# Patient Record
Sex: Female | Born: 1955 | Race: White | Hispanic: No | Marital: Married | Smoking: Current every day smoker
Health system: Southern US, Community
[De-identification: ages and names within clinical notes are randomized; demographics above are authoritative.]

## PROBLEM LIST (undated history)

## (undated) DIAGNOSIS — F1010 Alcohol abuse, uncomplicated: Secondary | ICD-10-CM

## (undated) HISTORY — PX: OTHER SURGICAL HISTORY: SHX169

---

## 2009-04-16 ENCOUNTER — Ambulatory Visit: Payer: Self-pay | Admitting: Family Medicine

## 2009-04-16 DIAGNOSIS — F3289 Other specified depressive episodes: Secondary | ICD-10-CM | POA: Insufficient documentation

## 2009-04-16 DIAGNOSIS — F329 Major depressive disorder, single episode, unspecified: Secondary | ICD-10-CM

## 2009-04-16 DIAGNOSIS — F17200 Nicotine dependence, unspecified, uncomplicated: Secondary | ICD-10-CM

## 2009-04-16 DIAGNOSIS — R259 Unspecified abnormal involuntary movements: Secondary | ICD-10-CM | POA: Insufficient documentation

## 2009-04-16 DIAGNOSIS — F411 Generalized anxiety disorder: Secondary | ICD-10-CM

## 2009-04-16 LAB — CONVERTED CEMR LAB
ALT: 63 units/L — ABNORMAL HIGH (ref 0–35)
AST: 56 units/L — ABNORMAL HIGH (ref 0–37)
Alcohol, Ethyl (B): <10 mg/dL (ref 0–10)
Alkaline Phosphatase: 61 units/L (ref 39–117)
Ammonia: 29 umol/L (ref 11–35)
BUN: 9 mg/dL (ref 6–23)
Bilirubin, Direct: 0.2 mg/dL (ref 0.0–0.3)
Calcium: 9.7 mg/dL (ref 8.4–10.5)
Creatinine, Ser: 0.5 mg/dL (ref 0.4–1.2)
Eosinophils Relative: 0.6 % (ref 0.0–5.0)
Free T4: 0.7 ng/dL (ref 0.6–1.6)
GFR calc non Af Amer: 136.83 mL/min (ref >60)
Lymphocytes Relative: 22.5 % (ref 12.0–46.0)
Monocytes Absolute: 0.7 10*3/uL (ref 0.1–1.0)
Monocytes Relative: 15.9 % — ABNORMAL HIGH (ref 3.0–12.0)
Neutrophils Relative %: 60.9 % (ref 43.0–77.0)
Platelets: 173 10*3/uL (ref 150.0–400.0)
RBC: 4.19 M/uL (ref 3.87–5.11)
Saturation Ratios: 36.1 % (ref 20.0–50.0)
Total Bilirubin: 1.3 mg/dL — ABNORMAL HIGH (ref 0.3–1.2)
Transferrin: 227.5 mg/dL (ref 212.0–360.0)
Vitamin B-12: 418 pg/mL (ref 211–911)
WBC: 4.1 10*3/uL — ABNORMAL LOW (ref 4.5–10.5)

## 2009-04-17 ENCOUNTER — Ambulatory Visit: Payer: Self-pay | Admitting: Family Medicine

## 2009-04-17 DIAGNOSIS — F1020 Alcohol dependence, uncomplicated: Secondary | ICD-10-CM | POA: Insufficient documentation

## 2011-04-12 ENCOUNTER — Emergency Department (HOSPITAL_COMMUNITY)
Admission: EM | Admit: 2011-04-12 | Discharge: 2011-04-12 | Disposition: A | Discharge status: Home / Self Care | Payer: No Typology Code available for payment source | Attending: Emergency Medicine | Admitting: Emergency Medicine

## 2011-04-12 ENCOUNTER — Emergency Department (HOSPITAL_COMMUNITY): Payer: No Typology Code available for payment source

## 2011-04-12 DIAGNOSIS — F1020 Alcohol dependence, uncomplicated: Secondary | ICD-10-CM | POA: Insufficient documentation

## 2011-04-12 DIAGNOSIS — S42209A Unspecified fracture of upper end of unspecified humerus, initial encounter for closed fracture: Secondary | ICD-10-CM | POA: Insufficient documentation

## 2011-04-12 DIAGNOSIS — Y92009 Unspecified place in unspecified non-institutional (private) residence as the place of occurrence of the external cause: Secondary | ICD-10-CM | POA: Insufficient documentation

## 2011-04-12 DIAGNOSIS — M25519 Pain in unspecified shoulder: Secondary | ICD-10-CM | POA: Insufficient documentation

## 2011-04-12 DIAGNOSIS — W010XXA Fall on same level from slipping, tripping and stumbling without subsequent striking against object, initial encounter: Secondary | ICD-10-CM | POA: Insufficient documentation

## 2011-04-18 ENCOUNTER — Emergency Department (HOSPITAL_COMMUNITY)
Admission: EM | Admit: 2011-04-18 | Discharge: 2011-04-18 | Disposition: A | Discharge status: Home / Self Care | Payer: No Typology Code available for payment source | Attending: Emergency Medicine | Admitting: Emergency Medicine

## 2011-04-18 DIAGNOSIS — E860 Dehydration: Secondary | ICD-10-CM | POA: Insufficient documentation

## 2011-04-18 DIAGNOSIS — F1010 Alcohol abuse, uncomplicated: Secondary | ICD-10-CM | POA: Insufficient documentation

## 2011-04-18 LAB — COMPREHENSIVE METABOLIC PANEL
ALT: 27 U/L (ref 0–35)
Calcium: 9.7 mg/dL (ref 8.4–10.5)
Creatinine, Ser: <0.47 mg/dL — ABNORMAL LOW (ref 0.50–1.10)
Glucose, Bld: 104 mg/dL — ABNORMAL HIGH (ref 70–99)
Sodium: 123 mEq/L — ABNORMAL LOW (ref 135–145)
Total Protein: 7.6 g/dL (ref 6.0–8.3)

## 2011-04-18 LAB — DIFFERENTIAL
Basophils Absolute: 0 10*3/uL (ref 0.0–0.1)
Basophils Relative: 1 % (ref 0–1)
Monocytes Relative: 15 % — ABNORMAL HIGH (ref 3–12)
Neutro Abs: 4 10*3/uL (ref 1.7–7.7)
Neutrophils Relative %: 61 % (ref 43–77)

## 2011-04-18 LAB — CBC
Hemoglobin: 11.8 g/dL — ABNORMAL LOW (ref 12.0–15.0)
RBC: 3.5 MIL/uL — ABNORMAL LOW (ref 3.87–5.11)

## 2011-04-18 LAB — RAPID URINE DRUG SCREEN, HOSP PERFORMED
Amphetamines: NOT DETECTED
Tetrahydrocannabinol: NOT DETECTED

## 2011-04-25 ENCOUNTER — Encounter (HOSPITAL_BASED_OUTPATIENT_CLINIC_OR_DEPARTMENT_OTHER)
Admission: RE | Admit: 2011-04-25 | Discharge: 2011-04-25 | Disposition: A | Discharge status: Home / Self Care | Payer: No Typology Code available for payment source | Source: Ambulatory Visit | Attending: Orthopedic Surgery | Admitting: Orthopedic Surgery

## 2011-04-26 ENCOUNTER — Ambulatory Visit (HOSPITAL_COMMUNITY): Payer: No Typology Code available for payment source | Attending: Orthopedic Surgery

## 2011-04-26 ENCOUNTER — Ambulatory Visit (HOSPITAL_BASED_OUTPATIENT_CLINIC_OR_DEPARTMENT_OTHER)
Admission: RE | Admit: 2011-04-26 | Discharge: 2011-04-27 | Disposition: A | Discharge status: Home / Self Care | Payer: No Typology Code available for payment source | Source: Ambulatory Visit | Attending: Orthopedic Surgery | Admitting: Orthopedic Surgery

## 2011-04-26 ENCOUNTER — Ambulatory Visit (HOSPITAL_COMMUNITY): Payer: No Typology Code available for payment source

## 2011-04-26 DIAGNOSIS — Z01810 Encounter for preprocedural cardiovascular examination: Secondary | ICD-10-CM | POA: Insufficient documentation

## 2011-04-26 DIAGNOSIS — S42213A Unspecified displaced fracture of surgical neck of unspecified humerus, initial encounter for closed fracture: Secondary | ICD-10-CM | POA: Insufficient documentation

## 2011-04-26 DIAGNOSIS — W19XXXA Unspecified fall, initial encounter: Secondary | ICD-10-CM | POA: Insufficient documentation

## 2011-04-26 DIAGNOSIS — Z01812 Encounter for preprocedural laboratory examination: Secondary | ICD-10-CM | POA: Insufficient documentation

## 2011-04-26 DIAGNOSIS — X58XXXA Exposure to other specified factors, initial encounter: Secondary | ICD-10-CM | POA: Insufficient documentation

## 2011-04-26 DIAGNOSIS — S42209A Unspecified fracture of upper end of unspecified humerus, initial encounter for closed fracture: Secondary | ICD-10-CM | POA: Insufficient documentation

## 2011-04-26 LAB — POCT HEMOGLOBIN-HEMACUE: Hemoglobin: 13.3 g/dL (ref 12.0–15.0)

## 2011-05-06 NOTE — Op Note (Signed)
Brittany Powell, Brittany Powell                ACCOUNT NO.:  [id number]  MEDICAL RECORD NO.:  [id number]  LOCATION:  WLED                         FACILITY:  [facility]  PHYSICIAN:  Jones Broom, MD    DATE OF BIRTH:  Sep 25, 1955  DATE OF PROCEDURE:  04/26/2011 DATE OF DISCHARGE:  04/18/2011                              OPERATIVE REPORT   PREOPERATIVE DIAGNOSIS:  Right displaced proximal humerus fracture.  POSTOPERATIVE DIAGNOSIS:  Right displaced proximal humerus fracture.  PROCEDURE PERFORMED:  Open reduction and internal fixation right proximal humerus fracture.  ATTENDING SURGEON:  Berline Lopes, MD  ASSISTANT:  Misty Stanley, PA-S  ANESTHESIA:  GOT with preoperative interscalene block.  COMPLICATIONS:  None.  DRAINS:  None.  SPECIMENS:  None.  ESTIMATED BLOOD LOSS:  100 mL.  INDICATIONS FOR SURGERY:  The patient is a 55 year old female who fell suffering a right displaced proximal humerus fracture.  She was seen by me approximately 10 days out from her injury where she was noted to have significant displacement of the fracture.  She was indicated for operative treatment to promote fracture healing and restore anatomic alignment.  She understood risks, benefits and alternatives of the procedure including, but not limited to risk of bleeding, infection, damage to neurovascular structures, risk of nonunion, malunion, potential need for future surgery including hardware removal.  She understood all this and elected to go forward with surgery.  OPERATIVE FINDINGS:  The fracture extended up to the lateral edge of the greater tuberosity.  It was significantly shortened initially and subsequently was pulled out to length after careful dissection. Fracture was fixed in a near-anatomic position with a S3 Biomet/DePuy plate with 6 locking pegs in the head and three 3.5-mm bicortical screws in the shaft with excellent fixation.  Additional fixation was achieved with three #2 fiber wires  through the cuff anterior, superior, posterior which were passed through the plate at the conclusion of procedure.  DESCRIPTION OF PROCEDURE:  The patient was identified in the preoperative holding area where I personally marked the operative site after verifying site, side, and procedure with the patient.  She was taken back to the operating room where general anesthesia was induced without complication.  She had interscalene block in the holding area by the attending anesthesiologist.  She was placed in a beach-chair position with all extremities carefully padded in position and head and neck carefully padded in position.  The right upper extremity was then prepped and draped in standard sterile fashion.  The patient did receive 1 gram IV vancomycin preoperatively.  After the appropriate time-out procedure, an approximately 10-cm incision was made from the coracoid tip to the mid level of the humerus.  Dissection was carried down through subcutaneous tissues to the level of the cephalic vein which was identified and taken laterally with the deltoid.  The deltopectoral interval was opened and the underlying conjoined tendon was identified. A retractor was placed beneath the conjoined tendon and beneath the deltoid laterally exposing the fracture.  The fracture was then carefully exposed using rongeur, Cobb elevators.  The proximal shaft was displaced anteriorly to allow exposure of the head and cuff insertion. The cuff bone/tendon junction was carefully then  controlled with three #2 fiber wires with one anteriorly in the subscapularis, one superiorly in the supraspinatus, one posteriorly in the infraspinatus at the bone/tendon junction.  The biceps tendon was identified and nearly completely lacerated from the injury.  The laceration was completed and the tendon was then cut at the level of the supraglenoid tubercle as well as at the level of the pectoralis major, and the  intervening segment was discarded.  The fracture was then reduced by pulling traction and using bone reduction forceps on the shaft.  After near- anatomic reduction was achieved, the plate was laid laterally and one K- wire was placed to verify positioning of the plate.  After the appropriate position was verified, the oblong screw ulna shaft was drilled, measured and filled with the appropriate 3.5 screw.  The proximal locking pegs were then placed sequentially with all obtaining good locking fixation into the plate.  No screws were felt to penetrate. Fluoroscopic imaging demonstrated appropriate reduction of the fracture and placement of the pegs with appropriate length and floor and round- the-world imaging with no penetration and the two remaining shaft screws were then drilled, measured and filled.  Final fluoroscopic imaging demonstrated appropriate reduction of the fracture with appropriate positioning of the hardware.  Wound was then copiously irrigated with normal saline.  The cephalic vein was noted to be completely transected from retraction through the procedure and this was carefully ligated. The wound was copiously irrigated and subsequently closed in layers with 2-0 Vicryl in deep dermal layer, 4-0 Monocryl for skin closure and Steri- Strips.  Sterile dressings were then applied including 4 x 4's, ABDs, and tape.  The patient was then allowed to awaken from general anesthesia, transferred to stretcher and taken to the recovery room in stable condition in a sling.  POSTOPERATIVE PLAN:  She will be kept overnight for pain control and antibiotics.  She will be discharged in the a.m.  She will begin hand, wrist, elbow motion and some gentle passive motion when she is comfortable.  She will follow up in 7-10 days.     Jones Broom, MD     JC/MEDQ  D:  04/26/2011  T:  04/27/2011  Job:  [id number]  Electronically Signed by Jones Broom  on 05/06/2011 09:39:48 AM

## 2011-07-18 ENCOUNTER — Emergency Department (HOSPITAL_COMMUNITY)
Admission: EM | Admit: 2011-07-18 | Discharge: 2011-07-19 | Disposition: A | Discharge status: Other Acute Inpatient Hospital | Payer: No Typology Code available for payment source | Attending: Psychiatry | Admitting: Psychiatry

## 2011-07-18 ENCOUNTER — Encounter: Payer: Self-pay | Admitting: *Deleted

## 2011-07-18 DIAGNOSIS — F17200 Nicotine dependence, unspecified, uncomplicated: Secondary | ICD-10-CM | POA: Insufficient documentation

## 2011-07-18 DIAGNOSIS — F10929 Alcohol use, unspecified with intoxication, unspecified: Secondary | ICD-10-CM

## 2011-07-18 DIAGNOSIS — F1010 Alcohol abuse, uncomplicated: Secondary | ICD-10-CM | POA: Insufficient documentation

## 2011-07-18 HISTORY — DX: Alcohol abuse, uncomplicated: F10.10

## 2011-07-18 LAB — CBC
HCT: 37.5 % (ref 36.0–46.0)
Hemoglobin: 13.1 g/dL (ref 12.0–15.0)
MCH: 32.3 pg (ref 26.0–34.0)
MCHC: 34.9 g/dL (ref 30.0–36.0)
MCV: 92.4 fL (ref 78.0–100.0)

## 2011-07-18 LAB — URINALYSIS, ROUTINE W REFLEX MICROSCOPIC
Hgb urine dipstick: NEGATIVE
Nitrite: NEGATIVE
Specific Gravity, Urine: 1.003 — ABNORMAL LOW (ref 1.005–1.030)
Urobilinogen, UA: 0.2 mg/dL (ref 0.0–1.0)

## 2011-07-18 LAB — BASIC METABOLIC PANEL
BUN: <3 mg/dL — ABNORMAL LOW (ref 6–23)
Calcium: 9 mg/dL (ref 8.4–10.5)
GFR calc non Af Amer: >90 mL/min (ref >90)
Glucose, Bld: 101 mg/dL — ABNORMAL HIGH (ref 70–99)

## 2011-07-18 LAB — DIFFERENTIAL
Basophils Relative: 3 % — ABNORMAL HIGH (ref 0–1)
Eosinophils Absolute: 0.1 10*3/uL (ref 0.0–0.7)
Monocytes Absolute: 0.7 10*3/uL (ref 0.1–1.0)
Neutro Abs: 2.2 10*3/uL (ref 1.7–7.7)

## 2011-07-18 LAB — RAPID URINE DRUG SCREEN, HOSP PERFORMED
Cocaine: NOT DETECTED
Opiates: NOT DETECTED

## 2011-07-18 MED ORDER — FOLIC ACID 1 MG PO TABS
1.0000 mg | ORAL_TABLET | Freq: Every day | ORAL | Status: DC
  Administered 2011-07-19: 1 mg via ORAL
  Filled 2011-07-18: qty 1

## 2011-07-18 MED ORDER — ONDANSETRON HCL 4 MG PO TABS: 4.0000 mg | ORAL_TABLET | Freq: Three times a day (TID) | ORAL | Status: DC | PRN

## 2011-07-18 MED ORDER — NICOTINE 21 MG/24HR TD PT24
21.0000 mg | MEDICATED_PATCH | Freq: Every day | TRANSDERMAL | Status: DC
  Administered 2011-07-19: 21 mg via TRANSDERMAL

## 2011-07-18 MED ORDER — ACETAMINOPHEN 325 MG PO TABS: 650.0000 mg | ORAL_TABLET | ORAL | Status: DC | PRN

## 2011-07-18 MED ORDER — VITAMIN B-1 100 MG PO TABS
100.0000 mg | ORAL_TABLET | Freq: Every day | ORAL | Status: DC
  Administered 2011-07-19: 100 mg via ORAL
  Filled 2011-07-18: qty 1

## 2011-07-18 MED ORDER — POTASSIUM CHLORIDE 20 MEQ PO PACK
40.0000 meq | PACK | Freq: Once | ORAL | Status: DC
  Filled 2011-07-18: qty 2

## 2011-07-18 MED ORDER — LORAZEPAM 1 MG PO TABS
1.0000 mg | ORAL_TABLET | Freq: Four times a day (QID) | ORAL | Status: DC | PRN
  Administered 2011-07-19 (×3): 1 mg via ORAL
  Filled 2011-07-18 (×3): qty 1

## 2011-07-18 MED ORDER — LORAZEPAM 1 MG PO TABS: 1.0000 mg | ORAL_TABLET | Freq: Three times a day (TID) | ORAL | Status: DC | PRN

## 2011-07-18 MED ORDER — LORAZEPAM 2 MG/ML IJ SOLN: 1.0000 mg | Freq: Four times a day (QID) | INTRAMUSCULAR | Status: DC | PRN

## 2011-07-18 MED ORDER — NICOTINE 21 MG/24HR TD PT24
21.0000 mg | MEDICATED_PATCH | Freq: Once | TRANSDERMAL | Status: DC
  Administered 2011-07-18: 21 mg via TRANSDERMAL
  Filled 2011-07-18 (×2): qty 1

## 2011-07-18 MED ORDER — THERA M PLUS PO TABS
1.0000 | ORAL_TABLET | Freq: Every day | ORAL | Status: DC
  Administered 2011-07-19: 10:00:00 via ORAL
  Filled 2011-07-18: qty 1

## 2011-07-18 MED ORDER — LORAZEPAM 1 MG PO TABS
1.0000 mg | ORAL_TABLET | Freq: Once | ORAL | Status: AC
  Administered 2011-07-18: 1 mg via ORAL
  Filled 2011-07-18: qty 1

## 2011-07-18 MED ORDER — THIAMINE HCL 100 MG/ML IJ SOLN: 100.0000 mg | Freq: Every day | INTRAMUSCULAR | Status: DC

## 2011-07-18 MED ORDER — ALUM & MAG HYDROXIDE-SIMETH 200-200-20 MG/5ML PO SUSP: 30.0000 mL | ORAL | Status: DC | PRN

## 2011-07-18 MED ORDER — POTASSIUM CHLORIDE CRYS ER 20 MEQ PO TBCR
EXTENDED_RELEASE_TABLET | ORAL | Status: AC
  Administered 2011-07-18: 40 meq via ORAL
  Filled 2011-07-18: qty 2

## 2011-07-18 NOTE — ED Notes (Signed)
Patient belongings with family. In triage room 4. Already changed in paper scrubs and has been wanded,

## 2011-07-18 NOTE — ED Notes (Signed)
Pt requesting detox from etoh. 5 beers drank today, last drink 4pm. Last detox pt began having nausea/tremors.

## 2011-07-18 NOTE — ED Notes (Signed)
Pt denies SI/HI 

## 2011-07-18 NOTE — ED Provider Notes (Signed)
History     CSN: [id number] Arrival date & time: 07/18/2011  5:39 PM      Chief Complaint  Patient presents with  . Drug / Alcohol Assessment   Patient is a 55 y.o. female presenting with drug/alcohol assessment. The history is provided by the patient and a relative. History Limited By: intoxicated.  Drug / Alcohol Assessment  Pt was seen at 1840.  Per pt and family, c/o gradual onset and persistence of constant ETOH abuse for "years."  States her last intake was approx 2 hours PTA.  States she "gets shaky" and nauseated if she does not drink alcohol.  Denies any other complaints. Denies HI, no SI.  Denies CP/SOB, no abd pain, no N/V/D.    Past Medical History  Diagnosis Date  . Alcohol abuse     Past Surgical History  Procedure Date  . Arm surgery      History  Substance Use Topics  . Smoking status: Current Everyday Smoker  . Smokeless tobacco: Not on file  . Alcohol Use: Yes     heavily   Review of Systems  Unable to perform ROS: Other    Allergies  Doxycycline and Penicillins  Home Medications   Current Outpatient Rx  Name Route Sig Dispense Refill  . ALPRAZOLAM 0.5 MG PO TABS Oral Take 0.25-0.5 mg by mouth at bedtime as needed. For anxiety/sleep.     Marland Kitchen CONJ ESTROG-MEDROXYPROGEST ACE 0.45-1.5 MG PO TABS Oral Take 1 tablet by mouth daily.      Carma Leaven M PLUS PO TABS Oral Take 1 tablet by mouth daily.      . VENLAFAXINE HCL 75 MG PO CP24 Oral Take 75 mg by mouth 2 (two) times daily.        BP 118/73  Pulse 75  Temp(Src) 98.3 F (36.8 C) (Oral)  Resp 17  Ht 5\' 8"  (1.727 m)  Wt 112 lb (50.803 kg)  BMI 17.03 kg/m2  SpO2 98%  Physical Exam 1845: Physical examination:  Nursing notes reviewed; Vital signs and O2 SAT reviewed;  Constitutional: Well developed, Well nourished, Well hydrated, In no acute distress; Head:  Normocephalic, atraumatic; Eyes: EOMI, PERRL, No scleral icterus; ENMT: Mouth and pharynx normal, Mucous membranes moist; Neck: Supple, Full  range of motion, No lymphadenopathy; Cardiovascular: Regular rate and rhythm, No murmur, rub, or gallop; Respiratory: Breath sounds clear & equal bilaterally, No rales, rhonchi, wheezes, or rub, Normal respiratory effort/excursion; Chest: Nontender, Movement normal; Abdomen: Soft, Nontender, Nondistended, Normal bowel sounds; Extremities: Pulses normal, No tenderness, No edema, No calf edema or asymmetry.; Neuro: AA&Ox3, Major CN grossly intact.  No gross focal motor or sensory deficits in extremities.; Skin: Color normal, Warm, Dry, Psych:  Affect flat.  Denies SI, no HI.   ED Course  Procedures   MDM  MDM Reviewed: nursing note and vitals Interpretation: labs   Results for orders placed during the hospital encounter of 07/18/11  ETHANOL      Component Value Range   Alcohol, Ethyl (B) 304 (*) 0 - 11 (mg/dL)  URINALYSIS, ROUTINE W REFLEX MICROSCOPIC      Component Value Range   Color, Urine YELLOW  YELLOW    Appearance CLEAR  CLEAR    Specific Gravity, Urine 1.003 (*) 1.005 - 1.030    pH 6.0  5.0 - 8.0    Glucose, UA NEGATIVE  NEGATIVE (mg/dL)   Hgb urine dipstick NEGATIVE  NEGATIVE    Bilirubin Urine NEGATIVE  NEGATIVE    Ketones,  ur NEGATIVE  NEGATIVE (mg/dL)   Protein, ur NEGATIVE  NEGATIVE (mg/dL)   Urobilinogen, UA 0.2  0.0 - 1.0 (mg/dL)   Nitrite NEGATIVE  NEGATIVE    Leukocytes, UA NEGATIVE  NEGATIVE   URINE RAPID DRUG SCREEN (HOSP PERFORMED)      Component Value Range   Opiates NONE DETECTED  NONE DETECTED    Cocaine NONE DETECTED  NONE DETECTED    Benzodiazepines NONE DETECTED  NONE DETECTED    Amphetamines NONE DETECTED  NONE DETECTED    Tetrahydrocannabinol NONE DETECTED  NONE DETECTED    Barbiturates NONE DETECTED  NONE DETECTED   CBC      Component Value Range   WBC 5.3  4.0 - 10.5 (K/uL)   RBC 4.06  3.87 - 5.11 (MIL/uL)   Hemoglobin 13.1  12.0 - 15.0 (g/dL)   HCT 11.9  14.7 - 82.9 (%)   MCV 92.4  78.0 - 100.0 (fL)   MCH 32.3  26.0 - 34.0 (pg)   MCHC 34.9   30.0 - 36.0 (g/dL)   RDW 56.2  13.0 - 86.5 (%)   Platelets 181  150 - 400 (K/uL)  DIFFERENTIAL      Component Value Range   Neutrophils Relative 41 (*) 43 - 77 (%)   Lymphocytes Relative 40  12 - 46 (%)   Monocytes Relative 14 (*) 3 - 12 (%)   Eosinophils Relative 2  0 - 5 (%)   Basophils Relative 3 (*) 0 - 1 (%)   Neutro Abs 2.2  1.7 - 7.7 (K/uL)   Lymphs Abs 2.1  0.7 - 4.0 (K/uL)   Monocytes Absolute 0.7  0.1 - 1.0 (K/uL)   Eosinophils Absolute 0.1  0.0 - 0.7 (K/uL)   Basophils Absolute 0.2 (*) 0.0 - 0.1 (K/uL)   WBC Morphology ATYPICAL LYMPHOCYTES    BASIC METABOLIC PANEL      Component Value Range   Sodium 129 (*) 135 - 145 (mEq/L)   Potassium 3.1 (*) 3.5 - 5.1 (mEq/L)   Chloride 91 (*) 96 - 112 (mEq/L)   CO2 24  19 - 32 (mEq/L)   Glucose, Bld 101 (*) 70 - 99 (mg/dL)   BUN <3 (*) 6 - 23 (mg/dL)   Creatinine, Ser 7.84 (*) 0.50 - 1.10 (mg/dL)   Calcium 9.0  8.4 - 69.6 (mg/dL)   GFR calc non Af Amer >90  >90 (mL/min)   GFR calc Af Amer >90  >90 (mL/min)   8:29 PM:  Will replete potassium PO, ativan already given for shakiness.  Will need lower etoh before ACT eval.   11:25 PM:  Etoh lower now.  CIWA protocol and ED Psych holding orders already completed.  Potassium repleted PO, BMP ordered to recheck electrolytes tomorrow morning.  ACT team to eval to admit for etoh abuse.    Daniella Dewberry Allison Quarry, DO 07/19/11 0016

## 2011-07-19 ENCOUNTER — Encounter (HOSPITAL_COMMUNITY): Payer: Self-pay | Admitting: *Deleted

## 2011-07-19 ENCOUNTER — Inpatient Hospital Stay (HOSPITAL_COMMUNITY)
Admission: AD | Admit: 2011-07-19 | Discharge: 2011-07-26 | DRG: 897 | Disposition: A | Discharge status: Home / Self Care | Payer: No Typology Code available for payment source | Source: Ambulatory Visit | Attending: Psychiatry | Admitting: Psychiatry

## 2011-07-19 DIAGNOSIS — F1020 Alcohol dependence, uncomplicated: Principal | ICD-10-CM | POA: Diagnosis present

## 2011-07-19 DIAGNOSIS — F17200 Nicotine dependence, unspecified, uncomplicated: Secondary | ICD-10-CM

## 2011-07-19 DIAGNOSIS — F1994 Other psychoactive substance use, unspecified with psychoactive substance-induced mood disorder: Secondary | ICD-10-CM

## 2011-07-19 DIAGNOSIS — Z6379 Other stressful life events affecting family and household: Secondary | ICD-10-CM

## 2011-07-19 DIAGNOSIS — F339 Major depressive disorder, recurrent, unspecified: Secondary | ICD-10-CM | POA: Diagnosis present

## 2011-07-19 DIAGNOSIS — Z818 Family history of other mental and behavioral disorders: Secondary | ICD-10-CM

## 2011-07-19 LAB — BASIC METABOLIC PANEL
BUN: 3 mg/dL — ABNORMAL LOW (ref 6–23)
CO2: 24 mEq/L (ref 19–32)
GFR calc non Af Amer: >90 mL/min (ref >90)
Glucose, Bld: 79 mg/dL (ref 70–99)
Potassium: 3.7 mEq/L (ref 3.5–5.1)

## 2011-07-19 LAB — URINE CULTURE: Culture: NO GROWTH

## 2011-07-19 MED ORDER — LOPERAMIDE HCL 2 MG PO CAPS: 2.0000 mg | ORAL_CAPSULE | ORAL | Status: AC | PRN

## 2011-07-19 MED ORDER — THIAMINE HCL 100 MG/ML IJ SOLN
100.0000 mg | Freq: Every day | INTRAMUSCULAR | Status: DC
  Administered 2011-07-21: 100 mg via INTRAVENOUS

## 2011-07-19 MED ORDER — FOLIC ACID 1 MG PO TABS
1.0000 mg | ORAL_TABLET | Freq: Every day | ORAL | Status: DC
  Administered 2011-07-20 – 2011-07-26 (×7): 1 mg via ORAL
  Filled 2011-07-19 (×9): qty 1

## 2011-07-19 MED ORDER — THERA M PLUS PO TABS
1.0000 | ORAL_TABLET | Freq: Every day | ORAL | Status: DC
  Administered 2011-07-20 – 2011-07-26 (×7): 1 via ORAL
  Filled 2011-07-19 (×8): qty 1

## 2011-07-19 MED ORDER — DIAZEPAM 5 MG PO TABS
10.0000 mg | ORAL_TABLET | Freq: Once | ORAL | Status: AC
  Administered 2011-07-19: 10 mg via ORAL
  Filled 2011-07-19: qty 2

## 2011-07-19 MED ORDER — VENLAFAXINE HCL ER 75 MG PO CP24
75.0000 mg | ORAL_CAPSULE | Freq: Every day | ORAL | Status: DC
  Administered 2011-07-19: 75 mg via ORAL
  Filled 2011-07-19 (×3): qty 1

## 2011-07-19 MED ORDER — ACETAMINOPHEN 325 MG PO TABS: 650.0000 mg | ORAL_TABLET | Freq: Four times a day (QID) | ORAL | Status: DC | PRN

## 2011-07-19 MED ORDER — HYDROCORTISONE 1 % EX CREA
TOPICAL_CREAM | Freq: Two times a day (BID) | CUTANEOUS | Status: DC
  Administered 2011-07-19: 13:00:00 via TOPICAL
  Filled 2011-07-19: qty 28

## 2011-07-19 MED ORDER — HYDROXYZINE PAMOATE 25 MG PO CAPS
25.0000 mg | ORAL_CAPSULE | Freq: Four times a day (QID) | ORAL | Status: AC | PRN
  Administered 2011-07-19 – 2011-07-21 (×3): 25 mg via ORAL

## 2011-07-19 MED ORDER — CHLORDIAZEPOXIDE HCL 25 MG PO CAPS: 25.0000 mg | ORAL_CAPSULE | ORAL | Status: DC

## 2011-07-19 MED ORDER — CHLORDIAZEPOXIDE HCL 25 MG PO CAPS
25.0000 mg | ORAL_CAPSULE | Freq: Four times a day (QID) | ORAL | Status: AC
  Administered 2011-07-19 – 2011-07-20 (×4): 25 mg via ORAL
  Filled 2011-07-19 (×4): qty 1

## 2011-07-19 MED ORDER — CHLORDIAZEPOXIDE HCL 25 MG PO CAPS: 25.0000 mg | ORAL_CAPSULE | Freq: Every day | ORAL | Status: DC

## 2011-07-19 MED ORDER — MAGNESIUM HYDROXIDE 400 MG/5ML PO SUSP: 30.0000 mL | Freq: Every day | ORAL | Status: DC | PRN

## 2011-07-19 MED ORDER — ALUM & MAG HYDROXIDE-SIMETH 200-200-20 MG/5ML PO SUSP: 30.0000 mL | ORAL | Status: DC | PRN

## 2011-07-19 MED ORDER — CHLORDIAZEPOXIDE HCL 25 MG PO CAPS
25.0000 mg | ORAL_CAPSULE | Freq: Four times a day (QID) | ORAL | Status: DC | PRN
  Administered 2011-07-20: 25 mg via ORAL
  Filled 2011-07-19: qty 1

## 2011-07-19 MED ORDER — ONDANSETRON HCL 4 MG PO TABS: 4.0000 mg | ORAL_TABLET | Freq: Three times a day (TID) | ORAL | Status: DC | PRN

## 2011-07-19 MED ORDER — VENLAFAXINE HCL ER 75 MG PO CP24
75.0000 mg | ORAL_CAPSULE | Freq: Every day | ORAL | Status: DC
  Administered 2011-07-20: 75 mg via ORAL
  Filled 2011-07-19 (×2): qty 1

## 2011-07-19 MED ORDER — VITAMIN B-1 100 MG PO TABS
100.0000 mg | ORAL_TABLET | Freq: Every day | ORAL | Status: DC
  Administered 2011-07-20 – 2011-07-26 (×6): 100 mg via ORAL
  Filled 2011-07-19 (×9): qty 1

## 2011-07-19 MED ORDER — CHLORDIAZEPOXIDE HCL 25 MG PO CAPS
25.0000 mg | ORAL_CAPSULE | Freq: Three times a day (TID) | ORAL | Status: DC
  Administered 2011-07-21 (×2): 25 mg via ORAL
  Filled 2011-07-19 (×2): qty 1

## 2011-07-19 MED ORDER — CHLORDIAZEPOXIDE HCL 25 MG PO CAPS
25.0000 mg | ORAL_CAPSULE | Freq: Once | ORAL | Status: AC
  Administered 2011-07-19: 25 mg via ORAL
  Filled 2011-07-19: qty 1

## 2011-07-19 NOTE — ED Provider Notes (Signed)
Patient is here voluntarily for alcohol abuse and rehabilitation.  She is currently having no significant tremors because she has been on the CIWA protocol.  She states that she still would like to receive treatment for this at this time and has no other acute medical issues.  Nat Christen, MD 07/19/11 [phone]

## 2011-07-19 NOTE — ED Notes (Signed)
Patient family taken clothes home

## 2011-07-19 NOTE — ED Notes (Signed)
Pt arrived to the room went into introduce self to the pt pt irritable reports that "I just want to go to sleep just tell me in the morning what you need to tell me" when asked what was the reason for coming to the ed pt reports "I just told them" when reconfirming information pt sts "I've already told you" pt refusing to speak with this recorder and take medications that were pending prior to arrival to the unit.

## 2011-07-19 NOTE — BH Assessment (Addendum)
Assessment Note   Brittany Powell is an 55 y.o. female  PRESENTING PROBLEM:  that was brought to the ER by her daughter and her best friend.  Patient was shaking visibility.  Pt was agitated and displayed a reluctance to participate in the assessment process.  Patient denied any psychosis.  Pateint's family has witness her talking to animals.  Patient denies any psychosis.  Patient reports that she has been drinking everyday for the past 20 years.  Patient reports that she does not want to be in detox for more than three days.  Patient reports that she does not have a problem with her beer intake.  Patient reports that she would rather be treated for her detox an outpatient level.  Patient reports that she promised her daughter that, "she would, "go to drug detox".  Patient reports that she has never been to any type of rehab in the past.  Patient reports that she has been exhibiting the following withdrawal symptoms: vomiting, irritability, tremors, weakness, blackouts  DISPOSITION:  Pending placement @ [facility].      AxisI             303.90  Alcohol Dependence  AxisII             799.9 Axis III           NONE Axis IV           Prob with prim support group    Axis V            46  Past Medical History:  Past Medical History  Diagnosis Date  . Alcohol abuse     Past Surgical History  Procedure Date  . Arm surgery     Family History: No family history on file.  Social History:  reports that she has been smoking.  She does not have any smokeless tobacco history on file. She reports that she drinks alcohol. She reports that she uses illicit drugs (Marijuana).  Allergies:  Allergies  Allergen Reactions  . Doxycycline   . Penicillins     Home Medications:  Medications Prior to Admission  Medication Dose Route Frequency Provider Last Rate Last Dose  . acetaminophen (TYLENOL) tablet 650 mg  650 mg Oral Q4H PRN Laray Anger, DO      . alum & mag hydroxide-simeth (MAALOX/MYLANTA)  200-200-20 MG/5ML suspension 30 mL  30 mL Oral PRN Laray Anger, DO      . folic acid (FOLVITE) tablet 1 mg  1 mg Oral Daily Laray Anger, DO      . LORazepam (ATIVAN) tablet 1 mg  1 mg Oral Q6H PRN Laray Anger, DO       Or  . LORazepam (ATIVAN) injection 1 mg  1 mg Intravenous Q6H PRN Laray Anger, DO      . LORazepam (ATIVAN) tablet 1 mg  1 mg Oral Once Laray Anger, DO   1 mg at 07/18/11 2108  . LORazepam (ATIVAN) tablet 1 mg  1 mg Oral Q8H PRN Laray Anger, DO      . multivitamins ther. w/minerals tablet 1 tablet  1 tablet Oral Daily Laray Anger, DO      . nicotine (NICODERM CQ - dosed in mg/24 hours) patch 21 mg  21 mg Transdermal Once Laray Anger, DO   21 mg at 07/18/11 2110  . nicotine (NICODERM CQ - dosed in mg/24 hours) patch 21 mg  21 mg Transdermal Daily  Laray Anger, DO      . ondansetron [facility]) tablet 4 mg  4 mg Oral Q8H PRN Laray Anger, DO      . potassium chloride SA (K-DUR,KLOR-CON) 20 MEQ CR tablet        40 mEq at 07/18/11 2109  . thiamine (VITAMIN B-1) tablet 100 mg  100 mg Oral Daily Laray Anger, DO       Or  . thiamine (B-1) injection 100 mg  100 mg Intravenous Daily Laray Anger, DO      . DISCONTD: potassium chloride (KLOR-CON) packet 40 mEq  40 mEq Oral Once Laray Anger, DO       No current outpatient prescriptions on file as of 07/18/2011.    OB/GYN Status:  No LMP recorded. Patient is postmenopausal.  General Assessment Data Living Arrangements: Spouse/significant other Can pt return to current living arrangement?: Yes Admission Status: Voluntary Is patient capable of signing voluntary admission?: Yes Transfer from: Home Referral Source: Self/Family/Friend  Risk to self Suicidal Ideation: No Suicidal Intent: No Is patient at risk for suicide?: No Suicidal Plan?: No Access to Means: No What has been your use of drugs/alcohol within the last 12 months?: ethol Other Self  Harm Risks: none Triggers for Past Attempts:  (n/a) Intentional Self Injurious Behavior: None Factors that decrease suicide risk:  (n/a) Family Suicide History: Yes (Father when she was 82 yo ) Recent stressful life event(s): Conflict (Comment) (strained relationships with children due to drinking) Persecutory voices/beliefs?: No Depression: Yes Depression Symptoms: Tearfulness;Feeling angry/irritable Suicide prevention information given to non-admitted patients: Not applicable  Risk to Others Homicidal Ideation: No Thoughts of Harm to Others: No Current Homicidal Intent: No Current Homicidal Plan: No Access to Homicidal Means: No Identified Victim:  (n/a) History of harm to others?: No Assessment of Violence: None Noted Violent Behavior Description:  (none) Does patient have access to weapons?: No Criminal Charges Pending?: No Does patient have a court date: No  Mental Status Report Appear/Hygiene: Bizarre;Disheveled Eye Contact: Poor Motor Activity: Agitation;Unsteady;Gait exaggerated;Tremors Speech: Argumentative;Aggressive;Loud;Abusive Level of Consciousness: Quiet/awake;Alert;Combative;Irritable Mood: Anxious;Suspicious;Angry;Ambivalent;Apprehensive;Irritable;Preoccupied Affect: Angry;Anxious Anxiety Level: None Thought Processes: Coherent;Relevant Judgement: Impaired Orientation: Person;Time;Situation;Place Obsessive Compulsive Thoughts/Behaviors: None  Cognitive Functioning Concentration: Normal Memory: Recent Intact;Remote Intact IQ: Average Insight: Fair Impulse Control: Poor Appetite: Poor Weight Loss:  (n/a) Weight Gain:  (n/a) Sleep: Decreased Total Hours of Sleep: 5  Vegetative Symptoms: Decreased grooming  Prior Inpatient/Outpatient Therapy Prior Therapy:  (would not answer) Prior Therapy Dates:  (would not answer ) Prior Therapy Facilty/Provider(s):  (n/a)            Values / Beliefs Cultural Requests During Hospitalization:  None Spiritual Requests During Hospitalization: None        Additional Information 1:1 In Past 12 Months?: No CIRT Risk: No Elopement Risk: No Does patient have medical clearance?: Yes     Disposition:     On Site Evaluation by:   Reviewed with Physician:     Phillip Heal LaVerne 07/19/2011 1:31 AM

## 2011-07-19 NOTE — ED Notes (Addendum)
Patient daughter and best friend left their number to be contacted daughter Brayton Caves [phone] robin best friend [phone]

## 2011-07-19 NOTE — Progress Notes (Signed)
  New admit. Voluntary. Appears disheveled, tremulous and anxious. Calm, pleasant and cooperative with assessment. A/Ox4. No acute distress noted. States she has been drink up to 10/day for the last three years. 25 family/friends had an "intervention" with her and asked her to get some help. States she does need some help. Has 2 children at [facility] and a supportive husband (does spend periods away from home caring for his sick mother). Denies any substance abuse besides ETOH. Flatly denies SI/HI/AVH and contracts for safety. Will be started on Librium detox. Q61minute checks initiated at 2000. Unit polices and expectations reviewed and understanding verbalized. POC and medications reviewed and understanding verbalized. Food and fluids offered and accepted. Escorted to unit and oriented by Marcelino Duster, MHT. Will continue to monitor.

## 2011-07-19 NOTE — ED Notes (Signed)
Pt has a bed at [facility], room #301 Bed 1, ACT speaking with pt at present, this nurse will attempt to call report when okayed by ACT, will monitor.
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
## 2011-07-20 DIAGNOSIS — F1020 Alcohol dependence, uncomplicated: Principal | ICD-10-CM

## 2011-07-20 DIAGNOSIS — F339 Major depressive disorder, recurrent, unspecified: Secondary | ICD-10-CM | POA: Diagnosis present

## 2011-07-20 MED ORDER — NICOTINE 21 MG/24HR TD PT24
21.0000 mg | MEDICATED_PATCH | Freq: Every day | TRANSDERMAL | Status: DC
  Administered 2011-07-20 – 2011-07-22 (×5): 21 mg via TRANSDERMAL
  Filled 2011-07-20 (×5): qty 1

## 2011-07-20 MED ORDER — CONJ ESTROG-MEDROXYPROGEST ACE 0.45-1.5 MG PO TABS: 1.0000 | ORAL_TABLET | Freq: Every day | ORAL | Status: DC

## 2011-07-20 MED ORDER — CONJ ESTROG-MEDROXYPROGEST ACE 0.45-1.5 MG PO TABS
1.0000 | ORAL_TABLET | Freq: Every day | ORAL | Status: DC
  Administered 2011-07-21 – 2011-07-26 (×6): 1 via ORAL

## 2011-07-20 MED ORDER — VENLAFAXINE HCL ER 75 MG PO CP24
75.0000 mg | ORAL_CAPSULE | Freq: Two times a day (BID) | ORAL | Status: DC
  Administered 2011-07-20 – 2011-07-26 (×12): 75 mg via ORAL
  Filled 2011-07-20 (×15): qty 1

## 2011-07-20 MED ORDER — NICOTINE 21 MG/24HR TD PT24: 21.0000 mg | MEDICATED_PATCH | Freq: Every day | TRANSDERMAL | Status: DC

## 2011-07-20 NOTE — Progress Notes (Signed)
Suicide Risk Assessment  Admission Assessment     Demographic factors:  Assessment Details Time of Assessment: Admission Information Obtained From: Patient Current Mental Status:  Current Mental Status:  (NONE, denies) Loss Factors:  Loss Factors: Financial problems / change in socioeconomic status (two kids in college and mil with multiple needs) Historical Factors:  Historical Factors: Family history of suicide;Family history of mental illness or substance abuse Risk Reduction Factors:  Risk Reduction Factors: Sense of responsibility to family;Living with another person, especially a relative;Positive social support  CLINICAL FACTORS:   Severe Anxiety and/or Agitation Alcohol/Substance Abuse/Dependencies More than one psychiatric diagnosis  Diagnosis:  Axis I: Alcohol Dependence. Major Depressive Disorder - Recurrent.   The patient was seen today and reports the following:   ADL's: Intact  Sleep: The patient reports to sleeping well without difficulty. Appetite: Good.   Mild>(1-10) >Severe  Hopelessness (1-10): 1  Depression (1-10): 1-2  Anxiety (1-10): 4   Suicidal Ideation: The patient denies any suicidal ideations today. Plan: No  Intent: No  Means: No Homicidal Ideation: The patient adamantly denies any homicidal ideations.  Plan: No  Intent: No.  Means: No   Speech: Normal Motor Behavior: Normal Level of Consciousness: Alert. Eye Contact: Good  General Appearance Brittany Powell: Neat and Casual  Mental Status: AO x 3  Mood: Depressed - Mild.  Affect: Mildly Constricted.  Anxiety Level: Mild to Moderate.  Thought Process: Coherent  Thought Content: WNL. No auditory or visual hallucinations or delusional thinking reported.  Perception: Normal  Judgment: Fair to Good.  Insight: Fair to Good  Cognition: Orientation time, place and person   Treatment Plan Summary:  1. Daily contact with patient to assess and evaluate symptoms and progress in treatment  2.  Medication management  3. The patient will deny suicidal ideations or homicidal ideations for 48 hours prior to discharge and have a depression and anxiety rating of 3 or less. The patient will also deny any auditory or visual hallucinations or delusional thinking.   Plan:  1. The patient will continue on the Librium Detox protocol as ordered to detox from alcohol. 2. The patient will continue her medication Effexor XR 75 mgs po qam as prescribed prior to admission for depression. 3. Continue to monitor.  SUICIDE RISK:   Minimal: No identifiable suicidal ideation.  Patients presenting with no risk factors but with morbid ruminations; may be classified as minimal risk based on the severity of the depressive symptoms   Brittany Powell 07/20/2011, 4:39 PM

## 2011-07-20 NOTE — Progress Notes (Signed)
Pt in dayroom, reading a magazine on approach. Appears anxious. Pleasant and cooperative with assessment. A/Ox4. No acute distress noted. States she has had a good day. When asked to qualify a good day, replied,"I was finally able to eat, my children came to visit, My bed got fixed and I have gone to groups and learned some things.". Support and encouragement provided. Writer shared and observation r/t her drastically decreased tremors. Pt acknowledged that she felt like they had improved. No questions or concerns expressed. Denies SI/HI/AVH and contracts for safety. Denies pain or discomfort. POC and medications for the shift reviewed and understanding verbalized. Safety has been maintained with Q51minute observations. Will continue current POC.

## 2011-07-20 NOTE — Progress Notes (Signed)
Interdisciplinary Treatment Plan Update (Adult)  Date: 07/20/2011  Time Reviewed: 1:40 PM   Progress in Treatment: Attending groups: Yes Participating in groups: Yes Taking medication as prescribed: Yes Tolerating medication: Yes   Family/Significant othe contact made:  Yes  CM spoke to husband with patient Patient understands diagnosis:  Yes as evidenced by discussing in group the need for help with quitting drinking Discussing patient identified problems/goals with staff:  Yes as evidenced by talking about  decreasing her isolation in the home Medical problems stabilized or resolved:  Yes Denies suicidal/homicidal ideation: Yes as evidenced by self inventory Issues/concerns per patient self-inventory:  No Other:  New problem(s) identified: N/A  Reason for Continuation of Hospitalization: Medication stabilization Withdrawal symptoms  Interventions implemented related to continuation of hospitalization: Librium detox protocol, Family contact, Encourage group participation  Additional comments: Brittany Powell is insisting she is fine, but appears unsteady on her feet and anxious  Today her husband revealed she has been taking xanax 3mg  daily for several years.  That was recently reduced to 1mg  daily by a different provider, her PCP.  Will need to monitor for withdrawal symptoms.  Estimated length of stay: 3-4 days  Discharge Plan: unknown,  Husband is saying he wants her to go to rehab, and has told her he is not OK with her returning home until she completes a program  New goal(s): N/A  Review of initial/current patient goals per problem list:   1.  Goal(s):Safely detox from alcohol, benzos  Met:  No  Target date:11/18  As evidenced [id number] in CIWA score from current to 0   2.  Goal (s):Michelle will identify a comprehensive sobriety plan  No  Target date:11/16  As evidenced [id number] to providers as identified  3.  Goal(s):  Met:  No  Target date:  As  evidenced by:  4.  Goal(s):  Met:  No  Target date:  As evidenced by:  Attendees: Patient:     Family:     Physician:  Orson Aloe, MD 07/20/2011 1:40 PM   Nursing:  Roswell Miners RN 07/20/2011 1:40 PM   Case Manager:  Richelle Ito, LCSW 07/20/2011 1:40 PM   Counselor:  Vanetta Mulders 07/20/2011 1:40 PM   Other:   07/20/2011 1:40 PM   Other:     Other:     Other:      Scribe for Treatment Team:   Ida Rogue, 07/20/2011 1:40 PM

## 2011-07-20 NOTE — Progress Notes (Addendum)
Pt attended morning d/c planning group. Stated it was her idea to come to the hospital because she felt that she was drinking too much beer and couldn't stop on her own. Says that she likes the taste of beer and that it helps to calm her down. Pt admits to isolating at home and drinking. Lives at home with her husband, 2 dogs, and 2 cats. Has twin daughters in college at [employer]. Plan is to call husband with patient today to figure out aftercare plan. Appeared tremulous but stated that she wasn't experiencing any other significant withdrawal symptoms.

## 2011-07-20 NOTE — Progress Notes (Signed)
Pt. Is pleasantly confused.She is med compliant and attending Groups.She does not know why she is here but knows that she has a problem with ETOH.Marland KitchenUses excessive makeup and becomes upset when she can not have it immediately.Encouraged and su

## 2011-07-20 NOTE — Progress Notes (Signed)
[facility] Group Notes:  (Counselor/Nursing/MHT/Case Management/Adjunct)  07/20/2011 5:19 PM  Type of Therapy:  Group Therapy  Participation Level:  Minimal  Participation Quality:  Attentive  Affect:  Appropriate  Cognitive:  Appropriate  Insight:  Limited  Engagement in Group:  Limited  Engagement in Therapy:  Limited  Modes of Intervention:  Problem-solving, Support and Exploration  Summary of Progress/Problems: Pt able to share her struggles to deal with the anxiety her kids cause her when they are being rude, stating it causes her to feel full of doubt and often is a trigger for her to drink. Pt quiet but attentive.   Purcell Nails 07/20/2011, 5:19 PM

## 2011-07-20 NOTE — Progress Notes (Signed)
  Psychiatric Admit Note Dictated # [id number].  Also note that patient takes EffexorXR 75mg  bid which she reports taking consistently, and continued her Prempro for hormone supplementation.

## 2011-07-20 NOTE — Progress Notes (Signed)
[facility] Group Notes:  (Counselor/Nursing/MHT/Case Management/Adjunct)  07/20/2011 5:47 PM  Type of Therapy:  group therapy  Participation Level:  Minimal  Participation Quality:  Attentive  Affect:  Appropriate  Cognitive:  Appropriate  Insight:  Limited  Engagement in Group:  Limited  Engagement in Therapy:  Limited  Modes of Intervention:  Problem-solving, Support and exploration  Summary of Progress/Problems: Pt was able to share that her drinking stems from her inability to deal with emotions. Pt able to site her daughter as a trigger. Pt quiet but attentive.  Purcell Nails 07/20/2011, 5:47 PM

## 2011-07-21 MED ORDER — CHLORDIAZEPOXIDE HCL 25 MG PO CAPS
25.0000 mg | ORAL_CAPSULE | Freq: Four times a day (QID) | ORAL | Status: AC
  Administered 2011-07-21 – 2011-07-22 (×6): 25 mg via ORAL
  Filled 2011-07-21 (×6): qty 1

## 2011-07-21 MED ORDER — CHLORDIAZEPOXIDE HCL 25 MG PO CAPS
25.0000 mg | ORAL_CAPSULE | Freq: Four times a day (QID) | ORAL | Status: DC | PRN
  Administered 2011-07-23 – 2011-07-25 (×3): 25 mg via ORAL
  Filled 2011-07-21 (×3): qty 1

## 2011-07-21 MED ORDER — CHLORDIAZEPOXIDE HCL 25 MG PO CAPS: 25.0000 mg | ORAL_CAPSULE | Freq: Four times a day (QID) | ORAL | Status: DC

## 2011-07-21 MED ORDER — CHLORDIAZEPOXIDE HCL 25 MG PO CAPS
25.0000 mg | ORAL_CAPSULE | Freq: Three times a day (TID) | ORAL | Status: AC
  Administered 2011-07-23 – 2011-07-25 (×9): 25 mg via ORAL
  Filled 2011-07-21 (×8): qty 1

## 2011-07-21 MED ORDER — CHLORDIAZEPOXIDE HCL 25 MG PO CAPS: 25.0000 mg | ORAL_CAPSULE | Freq: Three times a day (TID) | ORAL | Status: DC

## 2011-07-21 NOTE — Progress Notes (Signed)
  After 10:00 group pt was in bed crying and roommate was trying to comfort her.  Pt says she wants to go home and she feels ready to go.  Says she doesn't want to drink anymore and she isn't shaking anymore and she wants to leave.  Says she is a happy person and she is tired of hearing the same sad story over and over and it is making her depressed.  Says she doesn't want to go to anymore groups and listen to the same depressing things

## 2011-07-21 NOTE — Progress Notes (Addendum)
  Met with patient about 8:30 this morning and she is pleasant and reports feeling well, but appears tremulous, and appears in full detox.  Previous home medications included Alprazolam, in addition to the alcohol she was drinking, but in fact she was negative for benzodiazepines on her urine drug screen in the ED.  She is fully oriented to person, place and situation but avoids details of her history and speaks in superficial social stereotypical phrases, and given me little information about her alcohol abuse pattern or her mental health history.   She continues to have transient episodes of tachycardia with pulsing rising to 108.  She appears frail, thin, and malnourished.   Met with CM today who reports she called her family to bring her home.  They have expressed their concerns that she needs long term rehab and husband has apparently given her the message that she cannot come home.   A:  Alcohol Dependence.  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 She has informed me that she takes her Effexor XR 75mg  once daily, not twice daily.

## 2011-07-21 NOTE — Progress Notes (Signed)
[facility] Group Notes:  (Counselor/Nursing/MHT/Case Management/Adjunct)  07/21/2011 4:08 PM  Type of Therapy:  group therapy  Participation Level:  Minimal  Participation Quality:  Attentive  Affect:  Irritable  Cognitive:  Oriented  Insight:  Limited  Engagement in Group:  Limited  Engagement in Therapy:  Limited  Modes of Intervention:  Education, Problem-solving and Support  Summary of Progress/Problems: Pt was resistant to group therapy as she wanted to go home. Pt quiet, annoyed and slightly attentive.    Purcell Nails 07/21/2011, 4:08 PM
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
## 2011-07-21 NOTE — Progress Notes (Signed)
[facility] Group Notes:  (Counselor/Nursing/MHT/Case Management/Adjunct)  07/21/2011 3:13 PM  Type of Therapy:  group therapy  Participation Level:  Minimal  Participation Quality:  Resistant  Affect:  Anxious and Irritable  Cognitive:  Oriented  Insight:  None  Engagement in Group:  Limited  Engagement in Therapy:  Limited  Modes of Intervention:  Problem-solving, Support and exploration  Summary of Progress/Problems: Pt was hyper-focused on wanting to go home, pt believes she "does not belong with heroin addicts" and felt treatment was not for her. Pt emotional during group.   Purcell Nails 07/21/2011, 3:13 PM

## 2011-07-21 NOTE — Progress Notes (Signed)
Pt has been up and to groups interacting with peers and staff. Denies w/d symptoms.

## 2011-07-22 NOTE — Progress Notes (Signed)
[facility] Group Notes:  (Counselor/Nursing/MHT/Case Management/Adjunct)  07/22/2011 4:40 PM  Type of Therapy:  Group Therapy  Participation Level:  Minimal  Participation Quality:  Resistant  Affect:  Labile  Cognitive:  Oriented  Insight:  Limited  Engagement in Group:  Limited  Engagement in Therapy:  Limited  Modes of Intervention:  Problem-solving, Support and exploration  Summary of Progress/Problems: Pt denies that she will have an issue not drinking ever again, pt did state that she drinks more when she is board and under stress, pt was able to give an example of her daughters ex-boyfriends acting asa trigger for her to drink.    Purcell Nails 07/22/2011, 4:40 PM

## 2011-07-22 NOTE — Progress Notes (Signed)
Pt's visitor brought in a stuff animal and staff communicated to pt that items was not allowed on unit. Staff locked stuff animal in locker.

## 2011-07-22 NOTE — Progress Notes (Signed)
[facility] Group Notes:  (Counselor/Nursing/MHT/Case Management/Adjunct)  07/22/2011 3:39 PM  Type of Therapy:  group therapy  Participation Level:  Minimal  Participation Quality:  Intrusive, Inattentive and Resistant  Affect:  Irritable  Cognitive:  Alert  Insight:  Limited  Engagement in Group:  Limited  Engagement in Therapy:  Limited  Modes of Intervention:  Problem-solving, Support and exploration  Summary of Progress/Problems: Pt was resistant to be in group stating she no longer has a problem and knows for a fact she will never relapse. Pt states she has the perfect marriage and wants to go home. Pt is in denial. Pt was able to identify [facility] as a trigger as she likes to drink while watching it, pt states she has not watched the show in weeks and plans to avoid it. Pt acts negative towards group and tries to draw others in.   Brittany Powell 07/22/2011, 3:39 PM

## 2011-07-22 NOTE — Progress Notes (Signed)
Pt rates depression at a 0and hopelessness at a 0 Pt attends groups and interacts well with peers and staff. Pt seems to be bonding very well with roommate. Pt denies SI/HI. Pt has become a little bit upset this evening because her daughter did not bring her what she wanted. Pt also seems to have a bit of a tremor but it is not from her withdrawal as she says she has no symptoms. Pt was offered support and encouragement. Pt is receptive to treatment and safety maintained on the unit.

## 2011-07-22 NOTE — Progress Notes (Signed)
     Brittany Powell is a 55 y.o. female [id number] 08-Feb-1956  Subjective/Objective:  I saw the patient and reviewed the medical records. Patient is still anxious and tremulous. Patient reported that the she is not interested in any rehabilitation program and wanted to be discharged. I told her that there was a question of whether the husband wants her back or not she said that she don't know about it. She spoke to the husband and he did not mention to her about it. Patient is logical and goal-directed to the interview she is not suicidal homicidal. No psychotic symptoms present. I will speak with the treatment team regarding patient discharge plan.   History   Social History  . Marital Status: Married    Spouse Name: N/A    Number of Children: N/A  . Years of Education: N/A   Social History Main Topics  . Smoking status: Current Everyday Smoker  . Smokeless tobacco: None  . Alcohol Use: Yes     heavily  . Drug Use: Yes    Special: Marijuana  . Sexually Active:    Other Topics Concern  . None   Social History Narrative  . None     Lab Results:   BMET    Component Value Date/Time   NA 135 07/19/2011 0530   K 3.7 07/19/2011 0530   CL 98 07/19/2011 0530   CO2 24 07/19/2011 0530   GLUCOSE 79 07/19/2011 0530   BUN 3* 07/19/2011 0530   CREATININE 0.42* 07/19/2011 0530   CALCIUM 9.3 07/19/2011 0530   GFRNONAA >90 07/19/2011 0530   GFRAA >90 07/19/2011 0530    Medications:  Scheduled:     . chlordiazePOXIDE  25 mg Oral QID  . chlordiazePOXIDE  25 mg Oral TID  . estrogen (conjugated)-medroxyprogesterone  1 tablet Oral Daily  . folic acid  1 mg Oral Daily  . multivitamins ther. w/minerals  1 tablet Oral Daily  . nicotine  21 mg Transdermal Daily  . thiamine  100 mg Oral Daily   Or  . thiamine  100 mg Intravenous Daily  . venlafaxine  75 mg Oral BID  . DISCONTD: chlordiazePOXIDE  25 mg Oral TID  . DISCONTD: chlordiazePOXIDE  25 mg Oral BH-qamhs  . DISCONTD:  chlordiazePOXIDE  25 mg Oral Daily  . DISCONTD: chlordiazePOXIDE  25 mg Oral QID  . DISCONTD: chlordiazePOXIDE  25 mg Oral TID  . DISCONTD: chlordiazePOXIDE  25 mg Oral QID  . DISCONTD: chlordiazePOXIDE  25 mg Oral QID     PRN Meds acetaminophen, alum & mag hydroxide-simeth, chlordiazePOXIDE, hydrOXYzine, loperamide, magnesium hydroxide, ondansetron, DISCONTD: chlordiazePOXIDE   Plan: We'll continue current treatment plan. Psychoeducation given to the patient regarding alcohol abuse.     AHLUWALIA,SHAMSHER S 07/22/2011

## 2011-07-22 NOTE — Progress Notes (Signed)
Pt is a pleasnt 54 yr that attended [employer] and presented only with some reports of anxiety. Pt was given a prn dose of vistaril along with her scheduled dose pf librium. No further complaints followed. Pt is cooperative is reports no symptoms of withdrawal. Pt denies SI at this time. Support and availability as needed was extended to the patient. Pt safety remains with q93min checks.

## 2011-07-22 NOTE — Progress Notes (Signed)
Pt positive for group, interacting appropriately within the milieu.  No complaints voiced, denies SI/HI/hallucinations.  Pt does have tremors, do not appear to be withdrawal related.  Support and encouragement offered.  Will continue to monitor.

## 2011-07-22 NOTE — Progress Notes (Signed)
Pt attended AM group.  Feels fine.  Ready to go.  Unwilling to hear that she feels so well due to librium.  States she will not go to rehab, and her husband cannot tell her what to do.  Husband continues to be convinced that she will drink as soon as she is released.  Requests a family session over weekend so that he can talk to her in person about his concerns.  Says he will tell her she cannot come home until she completes rehab.  Left note for Marylene Land, weekend counselor, to call husband to set up family session.

## 2011-07-23 MED ORDER — NICOTINE 21 MG/24HR TD PT24
21.0000 mg | MEDICATED_PATCH | Freq: Every day | TRANSDERMAL | Status: DC
  Administered 2011-07-24 – 2011-07-26 (×3): 21 mg via TRANSDERMAL
  Filled 2011-07-23 (×4): qty 1

## 2011-07-23 MED ORDER — ENSURE CLINICAL ST REVIGOR PO LIQD
237.0000 mL | Freq: Three times a day (TID) | ORAL | Status: DC
  Administered 2011-07-23 – 2011-07-26 (×9): 237 mL via ORAL
  Filled 2011-07-23 (×14): qty 237

## 2011-07-23 MED ORDER — HYDROXYZINE HCL 50 MG PO TABS
50.0000 mg | ORAL_TABLET | Freq: Every day | ORAL | Status: DC
  Administered 2011-07-23 – 2011-07-25 (×3): 50 mg via ORAL
  Filled 2011-07-23 (×4): qty 1

## 2011-07-23 NOTE — Progress Notes (Signed)
[facility] MD Progress Note  07/23/2011 2:17 PM  Diagnosis:   Axis I: Depressive Disorder NOS, Substance Induced Mood Disorder and Alcohol Abuse Axis II: Deferred Axis III:  Past Medical History  Diagnosis Date  . Alcohol abuse    Axis IV: None Axis V: 41-50 serious symptoms  ADL's:  Intact  Sleep:  Yes,  AEB:  Appetite:  Yes,  AEB:  Suicidal Ideation:   Plan:  No  Intent:    Means:    Homicidal Ideation:   Plan:  Yes  Intent:    Means:    AEB (as evidenced by):  Mental Status: General Appearance Brittany Powell:  Disheveled Eye Contact:  Fair Motor Behavior:  Tremor Speech:  Normal Level of Consciousness:  Alert Mood:  Anxious Affect:  Constricted Anxiety Level:  Moderate Thought Process:  Coherent Thought Content:  WNL Perception:  Normal Judgment:  Poor Insight:  Absent Cognition:  Orientation time, place and person Memory Remote Concentration Yes Sleep:  Number of Hours: 6.5   Vital Signs:Blood pressure 137/80, pulse 72, temperature 97.8 F (36.6 C), temperature source Oral, resp. rate 18, height 5' 6.75" (1.695 m), weight 43.545 kg (96 lb), SpO2 93.00%. Body mass index is 15.15 kg/(m^2).   Lab Results: No results found for this or any previous visit (from the past 48 hour(s)).  Physical Findings:  CIWA:  CIWA-Ar Total: 4   Treatment Plan Summary: Daily contact with patient to assess and evaluate symptoms and progress in treatment Medication management  Plan: Continue medical detox.  Refer for outpatient treatment - patient is willing to attend IOP  Brittany Powell 07/23/2011, 2:17 PM

## 2011-07-23 NOTE — Progress Notes (Signed)
  Pt attending groups and interacting with other female peers. Became agitated after lunch when we would not go to her locker for hair products. Also was in denial of SA issues in group but did acknowledge that she would attend AA meetings and introduction to 12 steps was initiated.Rated depression at 1 and hopelessness at 2.  C/O left eye pain and prn pain medication requested and received.  Ongoing support given and 15' checks cont for safety.

## 2011-07-23 NOTE — Progress Notes (Signed)
   [facility] Group Notes:  (Counselor/Nursing/MHT/Case Management/Adjunct)  07/23/2011 1:15 PM  Type of Therapy:  Group Therapy, Dance/Movement Therapy   Participation Level:  Active  Participation Quality:  Appropriate, Sharing and Supportive  Affect:  Appropriate  Cognitive:  Appropriate  Insight:  Limited  Engagement in Group:  Good  Engagement in Therapy:  Good  Modes of Intervention:  Clarification, Problem-solving, Role-play, Socialization and Support  Summary of Progress/Problems:   Group discussed healthy and unhealthy ways of coping when feeling stuck. Pt. York Spaniel that she "helps others" as a healthy coping mechanism. Group also addressed how to recognize healthy and unhealthy relationships. To close group, pt. stated that she was thankful for husband, children, and home today.  Thomasena Edis, [employer]

## 2011-07-23 NOTE — Progress Notes (Signed)
Pt has been positive for group, utilizing coping skill better by calming herself down when could not get belongings from locker.  Pt interacting appropriately on the unit, no complaints voiced.  Interacting very well with roommate.  Support and encouragement offered, will continue to monitor.

## 2011-07-23 NOTE — Progress Notes (Signed)
[facility] Group Notes:  (Counselor/Nursing/MHT/Case Management/Adjunct)  07/23/2011 8:20 PM  Type of Therapy:  Psychoeducational Skills  Participation Level:  Minimal  Participation Quality:  Appropriate and Attentive  Affect:  Appropriate and Depressed  Cognitive:  Alert, Appropriate and Oriented  Insight:  Good  Engagement in Group:  Good  Engagement in Therapy:  n/a  Modes of Intervention:  Activity, Education, Problem-solving and Support  Summary of Progress/Problems: Group focused on communication. Brittany Powell was quiet but attentive while group discussed why communication is important, communication do's and don'ts, and reviewed "I feel" statements. Group read an example letter out loud using "I feel" statements.  Brittany Powell was given assignment to dissect a personal conflict and how communication went wrong, and what they could change to improve it.   Wandra Scot 07/23/2011, 8:20 PM

## 2011-07-24 MED ORDER — HYDROCORTISONE 1 % EX OINT
TOPICAL_OINTMENT | Freq: Four times a day (QID) | CUTANEOUS | Status: DC | PRN
  Filled 2011-07-24: qty 28.35

## 2011-07-24 NOTE — Progress Notes (Signed)
Spoke to pt about her D/C plans. Pt currently is stating that she does not want in-pt tx and she does not want to go to an IOP she states, "only wants AA". Pt was given a current [facility] schedule. Pt also stated that she has "good" communication with her husband and that she does not need a family session. Pt's husband was called and left a voice message regarding this update. Pt stated that husband was coming to lunch today therapist offered to meet with husband and answer additional questions if needed.

## 2011-07-24 NOTE — Progress Notes (Signed)
  Pt out in milieu with peers. Attending groups with participation.  Some mild confusion this am and obsessive thoughts and speech related to am conflict with 2 other patients on the unit.  C/O indigestion r/t "eating too much last night".  Denies SI.  Rates depression and hopelessness at 0, but affect is anxious. Pt continues to be in denial about SA issues. 12 step concepts reenforced. Ongoing support and encouragement and 15' checks cont for safety.

## 2011-07-24 NOTE — Progress Notes (Signed)
Pt anxious about behavior of another patient today, other Pt was quite loud and this scared her.  Pt positive for group but left before it was over due to anxiety.  Pt denies SI/HI/hallucinations, brightens on approach.  Pt interacting appropriately within milieu.  Support and encouragement given, assured PT of her safety on unit.  Will continue to monitor.

## 2011-07-24 NOTE — Progress Notes (Signed)
[facility] MD Progress Note  07/24/2011 1:20 PM  Diagnosis:   Axis I: See current hospital problem list Axis II: Deferred Axis III:  Past Medical History  Diagnosis Date  . Alcohol abuse    Axis IV: Unchanged Axis V: 41-50 serious symptoms  ADL's:  Intact  Sleep:  Yes,  AEB:  Appetite:  Yes,  AEB:  Suicidal Ideation:   Plan:  No  Intent:    Means:    Homicidal Ideation:   Plan:  No  Intent:    Means:    AEB (as evidenced by):  Mental Status: General Appearance Brittany Powell:  Disheveled Eye Contact:  Fair Motor Behavior:  Tremor Speech:  Normal Level of Consciousness:  Alert Mood:  Irritable Affect:  Constricted and Tearful Anxiety Level:  Moderate Thought Process:  Circumstantial and Tangential Thought Content:  WNL Perception:  Normal Judgment:  Poor Insight:  Absent Cognition:  Orientation time, place and person Memory Remote Concentration Yes Sleep:  Number of Hours: 5.25   Vital Signs:Blood pressure 104/74, pulse 82, temperature 97.3 F (36.3 C), temperature source Oral, resp. rate 20, height 5' 6.75" (1.695 m), weight 43.545 kg (96 lb), SpO2 93.00%.  Lab Results: No results found for this or any previous visit (from the past 48 hour(s)).  Physical Findings: CIWA:  CIWA-Ar Total: 3   Treatment Plan Summary: Daily contact with patient to assess and evaluate symptoms and progress in treatment Medication management  Plan: Pt appears to be rather irritable and depressed.  Insight is extremely poor, and she is very closed-minded.  We will continue a safe detox protocol and insure stability prior to discharge.  Brittany Powell 07/24/2011, 1:20 PM

## 2011-07-24 NOTE — Progress Notes (Signed)
[facility] Group Notes:  (Counselor/Nursing/MHT/Case Management/Adjunct)  07/24/2011 10:31 PM  Type of Therapy:  Psychoeducational Skills  Participation Level:  Minimal  Participation Quality:  Appropriate, Attentive and Supportive  Affect:  Appropriate  Cognitive:  Alert, Appropriate and Oriented  Insight:  Good  Engagement in Group:  Good  Engagement in Therapy:  n/a  Modes of Intervention:  Activity, Education, Problem-solving and Support  Summary of Progress/Problems: Group focused on gratitude. Brittany Powell was quiet but attentive while group discussed what gratitude is, how we can show it, what secondary gains can come of it, how it can strengthen support systems, and how it can increase self-worth. Brittany Powell listed things she is grateful for. Brittany Powell was encuraged to make a list of 20 things they are grateful for andto create a gratitrude journal to use daily. Group wrote down the things they are greatful for on leaves and taped them to a gratitude tree on the unit.    Wandra Scot 07/24/2011, 10:31 PM

## 2011-07-24 NOTE — Progress Notes (Signed)
[facility] Group Notes:  (Counselor/Nursing/MHT/Case Management/Adjunct)  07/24/2011 1:15 PM  Type of Therapy:  Group Therapy, Dance/Movement Therapy   Participation Level:  Active  Participation Quality:  Attentive, Sharing and Supportive  Affect:  Appropriate  Cognitive:  Appropriate  Insight:  Limited  Engagement in Group:  Good  Engagement in Therapy:  Limited  Modes of Intervention:  Clarification, Problem-solving, Role-play, Socialization and Support  Summary of Progress/Problems:  Pt participated in a group discussion on support systems. Pt spoke about what is needed to ask for help and how to be honest with themselves on what they truly need for successful recovery. Pt came into group late and was distracted and did not share personal experiences.  Brittany Powell

## 2011-07-25 MED ORDER — CHLORDIAZEPOXIDE HCL 25 MG PO CAPS
25.0000 mg | ORAL_CAPSULE | Freq: Two times a day (BID) | ORAL | Status: DC
  Administered 2011-07-26: 25 mg via ORAL
  Filled 2011-07-25 (×6): qty 1

## 2011-07-25 NOTE — Progress Notes (Signed)
Interdisciplinary Treatment Plan Update (Adult)  Date: 07/25/2011  Time Reviewed: 4:00 PM   Progress in Treatment: Attending groups: Yes Participating in groups: Yes Taking medication as prescribed: Yes Tolerating medication: Yes   Family/Significant othe contact made:  Yes family session today Patient understands diagnosis:  No  Absent of insight   Discussing patient identified problems/goals with staff:  Yes  States daily that she is fine and ready to be d/ced Medical problems stabilized or resolved:  Yes Denies suicidal/homicidal ideation: Yes Issues/concerns per patient self-inventory:  Yes  Expresses desire to leave ASAP Other:  New problem(s) identified: N/A  Reason for Continuation of Hospitalization: Medication stabilization Withdrawal symptoms  Interventions implemented related to continuation of hospitalization: Librium detox protocol, group therapy Additional comments:  Estimated length of stay: 1-2 days  Discharge Plan: Return home  IOP  AA mtgs  Husband desires pt to go to rehab; pt refuses to consider  New goal(s): N/A  Review of initial/current patient goals per problem list:    1.  Goal(s):Safely detox from alcohol, benzos  Met:  No  Target date:11/23 As evidenced [id number] rating from current rate ot 0 2.  Goal (s): Identify comprehensive sobriety plan  Met:  Yes  Target date:11/19 As evidenced by:Pt stating she will attend IOP [to Dr] and attend AA mtgs [to case manager] 3.  Goal(s):  Met:  No  Target date:  As evidenced by:  4.  Goal(s):  Met:  No  Target date: As evidenced by:   Attendees: Patient:     Family:     Physician:  Orson Aloe, MD 07/25/2011 4:00 PM   Nursing:    07/25/2011 4:00 PM   Case Manager:  Richelle Ito, LCSW 07/25/2011 4:00 PM   Counselor:   07/25/2011 4:00 PM   Other:   07/25/2011 4:00 PM   Other:     Other:     Other:      Scribe for Treatment Team:   Ida Rogue, 07/25/2011 4:00 PM

## 2011-07-25 NOTE — Progress Notes (Signed)
Met with patient's husband Elijah Birk.  H e expressed grave concerns about pt coming home.  He believes she will relapse immediately.  Reasons include recent past history [ loss of 30 lbs over last 4 months, unwillingness to stay for detox when at [facility] where she signed out AMA and was picked up by family walking on [address] hiding beer throughout the house, and stealing money from the safe to purchase beer.]  This is a fairly recent phenomenon. Marcelino Duster was a social drinker until the time her children left home 3 years ago for college, and her drinking has increased since then to the point it is now.   He also believes she will drink because of her lack of judgement and insight.  He believes she has no intent of stopping drinking.  He pleaded to advocate to the Dr that she be sent to [facility] since she won't voluntarily go to [facility].  I agreed to do so.  We brought her in to meet with her, and she immediately accused him of lying to her as her understanding was that she would be here no more than 3 days.  When he pointed out that its the Dr that makes the decision about d/c, she dismissed him and left the meeting.  Will pursue [facility] if Dr is in agreement.  If not, IOP is back up plan, though it appears unlikely that pt would follow through with that that plan.  I explained the IVC process to the husband so that he knows about that option should she return home and immediately relapse.

## 2011-07-25 NOTE — Progress Notes (Signed)
Patient ID: Brittany Powell, female   DOB: 05-26-1956, 55 y.o.   MRN: [id number]  Seen with Dr. Rogers Blocker today.  Brittany Powell wants to go home.  Does not want to go to Rehab.  See Rod North's note about the family session with the husband.    We reviewed her extended Librium Protocol so that she would understand why she felt so improved.  We are extending each dose for three days instead of 1 day.  Tomorrow she will start Librium 25mg  bid for 3 days, followed by Librium 25 mg daily for 3 days and she will be finished.   She agrees to attend IOP clinic at [facility] outpatient.  Dr. Rogers Blocker and I felt she deserved the opportunity to try IOP and prove her verbal intent to stay off all alcohol.  This is a 2 week/10-day program.  We have instructed her that she will need to get a ride to clinic and not drive herself since she will be taking medications.   Plan:  Will discharge in the am.

## 2011-07-25 NOTE — Progress Notes (Signed)
Pt. Is med compliant and attending Groups States she wants to go home to take care of her family.She denies Depression and Hopelessness.She also denies S/I,H/I and A/V hallucinations.Encouraged and supported.Nursing Note

## 2011-07-25 NOTE — Progress Notes (Signed)
[facility] Group Notes:  (Counselor/Nursing/MHT/Case Management/Adjunct)  07/25/2011 4:39 PM  Type of Therapy:  Group Therapy at 11:00AM  Participation Level:  None  Participation Quality:  Drowsy and Inattentive  Affect:  Flat  Cognitive:  Oriented  Insight:  None  Engagement in Group:  None  Engagement in Therapy:  None  Modes of Intervention:  Clarification, Orientation, Socialization and Support  Summary of Progress/Problems: Patient attended first 20 minutes of group session and then left, not to return. Pt did not respond to any of the three group questions asked during introduction.   Clide Dales 07/25/2011, 4:39 PM

## 2011-07-25 NOTE — Progress Notes (Signed)
[facility] Group Notes:  (Counselor/Nursing/MHT/Case Management/Adjunct)  07/25/2011 1:03 PM  Type of Therapy:  Group Therapy at 11:00AM  Participation Level:  Minimal  Participation Quality:  Drowsy  Affect:  Depressed  Cognitive:  Oriented  Insight:  Limited  Engagement in Group:  Limited  Engagement in Therapy:  None  Modes of Intervention:  Education, Problem-solving, Socialization and Support  Summary of Progress/Problems: Marcelino Duster remained quiet for entire group yet did appear attentive to discussion and psycho-ed portion of group on post acute withdrawal syndrome. Pt did share that she is somewhat anxious about family meeting at [facility] today.   Clide Dales 07/25/2011, 1:03 PM

## 2011-07-25 NOTE — Progress Notes (Signed)
[facility] Group Notes:  (Counselor/Nursing/MHT/Case Management/Adjunct)  07/25/2011 4:48 PM  Type of Therapy:  Psychoeducational Skills and Group Therapy at 1:15PM  Participation Level:  Minimal  Participation Quality:  Inattentive and Resistant  Affect:  Irritable  Cognitive:  Oriented  Insight:  Limited  Engagement in Group:  None  Engagement in Therapy:  None  Modes of Intervention:  Clarification, Problem-solving, Socialization and Support  Summary of Progress/Problems: Pt stated she would not be able to attend 1:15 group as she and family were to meet at [facility]. When writer suggested she had sufficient time and may as well join in afternoon group pt appeared to become irritated. Pt did not share during group and would not make eye contact with Clinical research associate.   Clide Dales 07/25/2011, 4:48 PM

## 2011-07-26 MED ORDER — CHLORDIAZEPOXIDE HCL 25 MG PO CAPS: 25.0000 mg | ORAL_CAPSULE | Freq: Two times a day (BID) | ORAL | Status: AC

## 2011-07-26 MED ORDER — FOLIC ACID 1 MG PO TABS: 1.0000 mg | ORAL_TABLET | Freq: Every day | ORAL | Status: AC

## 2011-07-26 MED ORDER — HYDROXYZINE HCL 50 MG PO TABS: 50.0000 mg | ORAL_TABLET | Freq: Every day | ORAL | Status: AC

## 2011-07-26 MED ORDER — THIAMINE HCL 100 MG PO TABS: 100.0000 mg | ORAL_TABLET | Freq: Every day | ORAL | Status: AC

## 2011-07-26 MED ORDER — CHLORDIAZEPOXIDE HCL 25 MG PO CAPS: 25.0000 mg | ORAL_CAPSULE | Freq: Every day | ORAL | Status: AC

## 2011-07-26 NOTE — Progress Notes (Signed)
Suicide Risk Assessment  Discharge Assessment     Demographic factors:  Assessment Details Time of Assessment: Admission Information Obtained From: Patient Current Mental Status:  Current Mental Status:  (NONE, denies) Risk Reduction Factors:  Risk Reduction Factors: Sense of responsibility to family;Living with another person, especially a relative;Positive social support  CLINICAL FACTORS:   Alcohol/Substance Abuse/Dependencies  COGNITIVE FEATURES THAT CONTRIBUTE TO RISK:  none  SUICIDE RISK:   Minimal: No identifiable suicidal ideation.  Patients presenting with no risk factors but with morbid ruminations; may be classified as minimal risk based on the severity of the depressive symptoms  PLAN OF CARE:  Patient denies suicidal or homicidal ideation, hallucinations, illusions, or delusions.  Patient engages with good eye contact, is able to focus adequately in a one to one setting, and has clear goal directed thoughts.  Patient speaks with a natural conversational volume, rate, and tone.  Anxiety was reported at 1 on a scale of 1 the least and 10 the most.  Depression was reported also at 1 on the same scale.  Patient is oriented times 4, recent and remote memory intact.  Judgment: Improved from admission  Insight: Improved from admission  SEE DISCHARGE SUMMARY  Brittany Powell,Brittany Powell 07/26/2011, 1:49 PM

## 2011-07-26 NOTE — Progress Notes (Signed)
Recreation Therapy Group Note  Date: 07/26/2011         Time: 1000       Group Topic/Focus: Patient invited to participate in animal assisted therapy. Pets as a coping skill and responsibility were discussed.   Participation Level: Active  Participation Quality: Appropriate and Attentive  Affect: Appropriate  Cognitive: Appropriate and Oriented   Additional Comments: Patient focused on discharge today, reports she has a Boxer at home.

## 2011-07-26 NOTE — Progress Notes (Signed)
tient ID:  Brittany Powell, female   DOB: October 25, 1955, 55 y.o.   MRN: [id number]

## 2011-07-26 NOTE — Progress Notes (Signed)
Patient ID: Brittany Powell, female   DOB: 26-Aug-1956, 55 y.o.   MRN: [id number] Patient was in dayroom this evening and attended group. Denies SI/HI/AV. Patient shows no signs of withdrawal this evening. Patient verbalized no complaints. Patient guarded but polite and appropriate when spoken to.

## 2011-07-26 NOTE — Progress Notes (Signed)
[facility] Group Notes:  (Counselor/Nursing/MHT/Case Management/Adjunct)  07/26/2011 3:11 PM  Type of Therapy:  Group Processing at 11:00 am  Participation Level:  None  Participation Quality:  Inattentive  Affect:  Flat  Cognitive:  Alert  Insight:  None shared  Engagement in Group:  None  Engagement in Therapy:  None  Modes of Intervention:  Clarification and Socialization  Summary of Progress/Problems: Patient was inattentive to discussion of group members feelings about their diagnosis.   Brittany Powell 07/26/2011, 3:11 PM

## 2011-07-27 ENCOUNTER — Other Ambulatory Visit (HOSPITAL_COMMUNITY): Payer: No Typology Code available for payment source | Attending: Physician Assistant | Admitting: Psychology

## 2011-07-27 ENCOUNTER — Encounter (HOSPITAL_COMMUNITY): Payer: Self-pay | Admitting: Psychology

## 2011-07-27 DIAGNOSIS — F411 Generalized anxiety disorder: Secondary | ICD-10-CM | POA: Insufficient documentation

## 2011-07-27 DIAGNOSIS — R259 Unspecified abnormal involuntary movements: Secondary | ICD-10-CM | POA: Insufficient documentation

## 2011-07-27 DIAGNOSIS — F17200 Nicotine dependence, unspecified, uncomplicated: Secondary | ICD-10-CM | POA: Insufficient documentation

## 2011-07-27 DIAGNOSIS — F3289 Other specified depressive episodes: Secondary | ICD-10-CM | POA: Insufficient documentation

## 2011-07-27 DIAGNOSIS — F1020 Alcohol dependence, uncomplicated: Secondary | ICD-10-CM | POA: Insufficient documentation

## 2011-07-27 DIAGNOSIS — F329 Major depressive disorder, single episode, unspecified: Secondary | ICD-10-CM | POA: Insufficient documentation

## 2011-07-27 NOTE — Progress Notes (Signed)
Patient ID: Brittany Powell, female   DOB: November 16, 1955, 55 y.o.   MRN: [id number] Orientation: Patient is a 55 yo married, caucasian, female seeking entry into the CD-IOP. She was discharged yesterday after 8 days in detox at [facility]. She lives in [place] with her husband. She has 61 yo twins and they both attend [employer] and live on campus. The patient was very irritable and unpleasant throughout most of the orientation. She reported she had not slept well while in detox and had just gotten home yesterday afternoon. She reported she had a schedule of AA meetings and planned on attending them. She did not deny having a problem with alcohol, but seemed very confused about her sobriety date. After some discussion, she agreed that she last drank on Sunday, the 11th. She reported that she did not appreciate people lying and stated that she had been told this program consisted of AA meetings. I explained the purpose and intentions of the CD-IOP, but reminded her that attendance in the 12-step community outside of this program would be required. She reported she does not work outside the home, but cleans and cooks and gardens and looks after their 3 large dogs. The documentation was reviewed, signatures gathered, and the orientation completed successfully. The patient agreed to return for group today at 1 pm.

## 2011-07-27 NOTE — Progress Notes (Signed)
[facility] Group Notes:  (Counselor/Nursing/MHT/Case Management/Adjunct)  07/27/2011 2:16 PM  Type of Therapy:  Group Therapy  Participation Level:  Minimal  Participation Quality: Appropriate and sharing  Affect:  Appropriate  Cognitive:  Appropriate  Insight:  Good  Engagement in Group:  Good  Engagement in Therapy:  Good  Modes of Intervention:  Support and Exploration  Summary of Progress/Problems: Brittany Powell processed triggers to relapse and developed alternative behaviors. Brittany Powell mentioned that pain is a trigger for her. She was called out of group before developing alternative behaviors.  Facilitated by [place] , MA, doctoral intern   Billie Lade 07/27/2011, 2:16 PM

## 2011-07-29 ENCOUNTER — Other Ambulatory Visit (HOSPITAL_COMMUNITY): Payer: No Typology Code available for payment source

## 2011-08-01 ENCOUNTER — Other Ambulatory Visit (HOSPITAL_COMMUNITY): Payer: No Typology Code available for payment source | Admitting: Psychology

## 2011-08-01 DIAGNOSIS — F1920 Other psychoactive substance dependence, uncomplicated: Secondary | ICD-10-CM

## 2011-08-01 NOTE — Discharge Summary (Signed)
Physician Discharge Summary  Patient ID: Brittany Powell MRN: [id number] DOB/AGE: 1956/06/06 55 y.o.  Admit date: 07/19/2011 Discharge date: 08/01/2011   Discharge Diagnoses:   Axis I: Alcohol dependence, Major Depressive Disorder. Axis II: No diagnosis Axis III: No diagnosis Axis IV: Significant family and social stressors related to alcohol abuse Axis V current 55 past year 74   Discharged Condition: stable  Hospital Course:   This was the first admission for Brittany Powell is a 55 year old female with a significant history of alcohol abuse. She initially been brought to the emergency room by her family and left there AMA. She had refused treatment and they found her walking on [employer]. She then represented to the emergency room with an alcohol level of 304 mg/dL. She was stabilized and transferred to our dual diagnosis unit for detox from alcohol.  Brittany Powell was started on a Librium detox protocol with a goal of safe detox in 4 days. She was found to be significantly tachycardic initially in her detox protocol was extended, starting with Librium 25 mg 4 times a day for 3 days, followed by 25 mg 3 times a day for 3 days, then twice a day for 3 days, then once daily for 3 days. She tolerated this protocol fairly well in her tachycardia gradually resolve. CBC was normal. UDS negative for all substances.   Throughout her stay here she minimized her alcohol problem. And in group therapy was generally resistant and inattentive. Her husband expressed the family significant concerns about her drinking at home. She apparently been stashing alcohol and various places. And had lost approximately 30 pounds in the last 4 months. She consistently denied any suicidal thoughts while here. She has been under treatment with Effexor for depression and for more than a year. She was also taken alprazolam and in a low dose every night to help with sleep. She was noted to be negative for benzodiazepines in  her urine drug screen at the time of admission.  Family session was held with her husband while she was here and he was farm and resolved that she needed ongoing treatment. Initially we were planning on sending her for inpatient treatment. She requested to go home and ultimately agreed to attend our intensive outpatient program for chemical dependency.  She was ready for discharge by the 26th.   Discharge Exam: Blood pressure 96/73, pulse 96, temperature 97.5 F (36.4 C), temperature source Oral, resp. rate 18, height 5' 6.75" (1.695 m), weight 43.545 kg (96 lb), SpO2 93.00%.  Disposition: Home or Self Care  Discharge Orders    Future Orders Please Complete By Expires   Diet - low sodium heart healthy      Increase activity slowly      Scheduling Instructions:   No driving for two weeks.   Discharge instructions      Comments:   No alcohol No driving for two weeks Take medications as prescribed. Attend Intensive Outpatient Program     Discharge Medication List as of 07/26/2011  2:41 PM    START taking these medications   Details  !! chlordiazePOXIDE (LIBRIUM) 25 MG capsule Take 1 capsule (25 mg total) by mouth 2 (two) times daily., Starting 07/26/2011, Until Thu 07/28/11, No Print    !! chlordiazePOXIDE (LIBRIUM) 25 MG capsule Take 1 capsule (25 mg total) by mouth at bedtime., Starting 07/29/2011, Until Sun 07/31/11, No Print    folic acid (FOLVITE) 1 MG tablet Take 1 tablet (1 mg total) by mouth daily., Starting 07/26/2011,  Until Wed 07/25/12, Print    hydrOXYzine (ATARAX/VISTARIL) 50 MG tablet Take 1 tablet (50 mg total) by mouth at bedtime., Starting 07/26/2011, Until Fri 08/05/11, Print    thiamine 100 MG tablet Take 1 tablet (100 mg total) by mouth daily., Starting 07/26/2011, Until Wed 07/25/12, Print     !! - Potential duplicate medications found. Please discuss with provider.    CONTINUE these medications which have NOT CHANGED   Details  estrogen,  conjugated,-medroxyprogesterone (PREMPRO) 0.45-1.5 MG per tablet Take 1 tablet by mouth daily.  , Until Discontinued, Historical Med    Multiple Vitamins-Minerals (MULTIVITAMINS THER. W/MINERALS) TABS Take 1 tablet by mouth daily.  , Until Discontinued, Historical Med    venlafaxine (EFFEXOR-XR) 75 MG 24 hr capsule Take 75 mg by mouth 2 (two) times daily.  , Until Discontinued, Historical Med      STOP taking these medications     ALPRAZolam (XANAX) 0.5 MG tablet        Follow-up Information    Follow up with [facility] on 07/27/2011. (10:00AM orientation,  group is at 1:00PM  Also, attend an AA mtg daily)    Contact information:   [address]   The entrance is on the south side of the building.  there are steps outside that lead down to double glass doors under an awning         Signed: Syd Newsome A 08/01/2011, 10:29 AM

## 2011-08-01 NOTE — Progress Notes (Signed)
    Daily Group Progress Note  Program: CD-IOP   Group Time: 1-2:30 pm  Participation Level: Minimal  Behavioral Response: patient shared a little about herself and seemed comfortable  Type of Therapy: Psycho-education Group  Topic: Communication: A presentation was provided on different types of communication styles. A handout was provided with examples of assertive, passive, aggressive and passive-aggressive communication styles. A scenario was read and members acted out 3 different role plays demonstrating the different ways they could communicate. There was good discussion and members identified their typical communication styles, most of which were passive and passive-aggressive. There was good discussion with examples provided and the group reflecting back on their perspectives and experiences.     Group Time: 2:45-4 pm  Participation Level: Minimal  Behavioral Response: Sharing  Type of Therapy: Process Group  Topic: Process group; members shared about feelings and frustrations in early recovery. Discussion about upcoming holiday and plans to engage in activities that support ongoing sobriety.   Summary: This patient was new to the group. She introduced herself and shared her sobriety date as the group began. She made some comments during the communication presentation and identified the frustration she felt when family members ask her something, but then negate or ignore her response. The patient seemed to enjoy the session and laughed or made comments on a number of the examples and scenarios provided. When asked what she planned over the holiday weekend, the patient reported her intention to remain sober and attend some meetings. She made some good comments and responded well to this first group session.     Family Program: Family present? No   Name of family member(s):   UDS collected: No Results:  AA/NA attended?: No  Sponsor?: No   Siedah Sedor,  LCAS

## 2011-08-02 NOTE — Progress Notes (Signed)
    Daily Group Progress Note  Program: CD-IOP   Group Time: 1-2:30 pm  Participation Level: Minimal  Behavioral Response: Rigid  Type of Therapy: Process Group  Topic:  Group Process; first half of group was spent in process. Members shared about their holiday weekends and the numerous ways they spent their long holiday weekend. Three members admitted using over the weekend and their "slips" were processed and discussed at length. The contrast in motivation and intention was quite apparent among the group members and while some members attended many meetings over the weekend, others did not go to any meetings or seek out support for their recovery.      Group Time: 2:45-4 pm  Participation Level: Minimal  Behavioral Response: patient said little during the viewing of the DVD  Type of Therapy: Psycho-education Group  Topic: The Neurobiology of Addiction, Part 1. The second half of group was spent watching the DVD, "The Neurobiology of Addiction". It presented a very informative presentation on the brain chemistry of addiction and the chemicals that chemically dependent people are missing or experience reduced levels of. The film was halted numerous times during the presentation and the concepts discussed thoroughly to insure that group members clearly understood the chemical nature of their addiction.    Summary: The patient reported she and her family had gotten a meal made elsewhere, but ate at home and she said the dinner was very good. When asked about whether she had had anything to drink, she reported she had not had anything and felt nauseous just thinking about alcohol. I assured her that there would come a time when she would crave alcohol, but she brushed aside this observation and insisted that would never happen. The patient made comments throughout the remainder of the first half of group, but they are often poorly related to the topic or out of context. The patient seems  somewhat discombobulated, but had on nicer clothing today and had put on makeup. She had not attended any 12-step meetings yet, but insisted that she would be going with a friend she had met while in detox upstairs in the [facility]    Family Program: Family present? No   Name of family member(s):   UDS collected: Yes Results:   AA/NA attended?: No  Sponsor?: No   Josealberto Montalto, LCAS

## 2011-08-03 ENCOUNTER — Other Ambulatory Visit (HOSPITAL_COMMUNITY): Payer: No Typology Code available for payment source

## 2011-08-03 ENCOUNTER — Telehealth (HOSPITAL_COMMUNITY): Payer: Self-pay | Admitting: Psychology

## 2011-08-05 ENCOUNTER — Other Ambulatory Visit (HOSPITAL_COMMUNITY): Payer: No Typology Code available for payment source

## 2011-08-05 NOTE — H&P (Signed)
NAMEIYSIS, GERMAIN               ACCOUNT NO.:  [id number]  MEDICAL RECORD NO.:  [id number]  LOCATION:  0301                          FACILITY:  [facility]  PHYSICIAN:  Jasiri Hanawalt A. Aneliz Carbary, N.P.DATE OF BIRTH:  [birth year]  DATE OF ADMISSION:  07/19/2011 DATE OF DISCHARGE:                      PSYCHIATRIC ADMISSION ASSESSMENT   IDENTIFYING INFORMATION:  This is a 55 year old Caucasian female.  This is a voluntary admission.  HISTORY OF PRESENT ILLNESS:  1st [facility] admission and 1st detox admission for Marcelino Duster, a 55 year old mother who was brought to the emergency room by her daughter.  She has twin 80 year old daughters at a local university, and she promised them that she would go in for alcohol detox.  She reports that she has been drinking up to 10 beers a day for the last 3 years and reports that family and friends had an "intervention" with her and asked her to get some help.  She has agreed to detox.  She has presented today mildly irritable, mildly tremulous, but says she wants to be here and is feeling fine.  She denies any subjective physical symptoms despite her transient elevated pulse and blood pressure.  PAST PSYCHIATRIC HISTORY:  She denies any previous treatment for substance abuse.  Denies any previous participation in [place].  She has consistently denied any suicidal thoughts or any other dangerous thoughts.  Says she has been drinking pretty steadily for the past 3 years and denies a family history of mental illness or substance abuse.  SOCIAL HISTORY:  This is a 55 year old female, lives at home with her husband, cares for a couple of dogs and has 2 children in college.  She denies any legal problems.  MEDICAL HISTORY:  Primary care physician is unknown.  Medical problems: She denies.  Past medical history significant for a 3rd molar extraction.  She denies a history of blackouts, seizures, or brain injury.  No history of diabetes or heart problems.  MEDICAL EVALUATION:  I  have medically and physically examined her today and find no variances from the emergency room report.  She was also medically evaluated in the [facility] Emergency room on November 13 when she presented.  That full transcription is available in the record. This is a fully-alert female, weighs 44 kg approximately 96 pounds, and is about 5 feet 6 inches tall.  DIAGNOSTIC STUDIES:  Normal electrolytes.  On initial presentation 2 days ago her sodium was noted to be 129, potassium 3.1.  Her BUN is 3, creatinine 0.42.  Random glucose 101.  Initial ethanol level was 304 mg/dL on November 12.  CBC is normal with a WBC of 5.3, hemoglobin 13.1, hematocrit 37.5, and platelets 181,000.  Routine urinalysis was unremarkable.  Urine drug screen negative for all substances.  MENTAL STATUS EXAM:  Fully alert female, appropriately dressed, very mild tremulousness.  Denying any withdrawal symptoms subjectively. Appetite is good.  Participation in group has been satisfactory.  In full contact with reality.  No evidence of acute confusion or psychosis. She is mildly irritable.  Offers little information other than to say she is here for detox and does not want to discuss many details about her substance abuse history.  She has refused to  allow [place] to speak with her husband or other family members and believes this is not necessary. No evidence of psychotic thinking.  She has consistently denied any suicidal thoughts, and group participation has been good.  Memory intact.  Insight adequate.  DIAGNOSES: Axis I:  Alcohol dependence. Axis II:  No diagnosis. Axis III:  No diagnosis. Axis IV:  Deferred. Axis V:  Current 45, past year 60 estimated.  PLAN:  To voluntarily admit her with a goal of a safe detox in 4 days, and we started her on a Librium protocol, and she is tolerating it well.     Konya Fauble A. Lorin Picket, N.P.     MAS/MEDQ  D:  07/20/2011  T:  07/21/2011  Job:  [id number]

## 2011-08-08 ENCOUNTER — Other Ambulatory Visit (HOSPITAL_COMMUNITY): Payer: No Typology Code available for payment source | Attending: Physician Assistant

## 2011-08-08 ENCOUNTER — Telehealth (HOSPITAL_COMMUNITY): Payer: Self-pay | Admitting: Psychology

## 2011-08-08 ENCOUNTER — Encounter (HOSPITAL_COMMUNITY): Payer: Self-pay | Admitting: Psychology

## 2011-08-08 NOTE — Progress Notes (Signed)
Patient ID: Brittany Powell, female   DOB: 1955/12/31, 55 y.o.   MRN: [id number] The patient had phoned on Wednesday evening stating that she would not be returning to the CD-IOP. She had been referred to this program after being discharged from Detox due to her alcohol dependence. The patient will be discharged from the CD-IOP.

## 2011-08-10 ENCOUNTER — Other Ambulatory Visit (HOSPITAL_COMMUNITY): Payer: No Typology Code available for payment source

## 2011-08-12 ENCOUNTER — Other Ambulatory Visit (HOSPITAL_COMMUNITY): Payer: No Typology Code available for payment source

## 2011-08-15 ENCOUNTER — Other Ambulatory Visit (HOSPITAL_COMMUNITY): Payer: No Typology Code available for payment source | Admitting: Psychology

## 2011-08-17 ENCOUNTER — Other Ambulatory Visit (HOSPITAL_COMMUNITY): Payer: No Typology Code available for payment source

## 2011-08-19 ENCOUNTER — Other Ambulatory Visit (HOSPITAL_COMMUNITY): Payer: No Typology Code available for payment source

## 2011-08-22 ENCOUNTER — Other Ambulatory Visit (HOSPITAL_COMMUNITY): Payer: No Typology Code available for payment source

## 2011-08-24 ENCOUNTER — Other Ambulatory Visit (HOSPITAL_COMMUNITY): Payer: No Typology Code available for payment source

## 2011-08-26 ENCOUNTER — Other Ambulatory Visit (HOSPITAL_COMMUNITY): Payer: No Typology Code available for payment source

## 2011-08-29 ENCOUNTER — Other Ambulatory Visit (HOSPITAL_COMMUNITY): Payer: No Typology Code available for payment source

## 2011-08-31 ENCOUNTER — Other Ambulatory Visit (HOSPITAL_COMMUNITY): Payer: No Typology Code available for payment source

## 2011-09-02 ENCOUNTER — Other Ambulatory Visit (HOSPITAL_COMMUNITY): Payer: No Typology Code available for payment source

## 2011-09-05 ENCOUNTER — Other Ambulatory Visit (HOSPITAL_COMMUNITY): Payer: No Typology Code available for payment source

## 2011-09-07 ENCOUNTER — Other Ambulatory Visit (HOSPITAL_COMMUNITY): Payer: No Typology Code available for payment source | Attending: Physician Assistant

## 2011-09-09 ENCOUNTER — Other Ambulatory Visit (HOSPITAL_COMMUNITY): Payer: No Typology Code available for payment source

## 2011-09-12 ENCOUNTER — Other Ambulatory Visit (HOSPITAL_COMMUNITY): Payer: No Typology Code available for payment source

## 2011-09-14 ENCOUNTER — Other Ambulatory Visit (HOSPITAL_COMMUNITY): Payer: No Typology Code available for payment source

## 2011-09-16 ENCOUNTER — Other Ambulatory Visit (HOSPITAL_COMMUNITY): Payer: No Typology Code available for payment source

## 2011-09-19 ENCOUNTER — Other Ambulatory Visit (HOSPITAL_COMMUNITY): Payer: No Typology Code available for payment source

## 2011-09-21 ENCOUNTER — Other Ambulatory Visit (HOSPITAL_COMMUNITY): Payer: No Typology Code available for payment source

## 2011-09-23 ENCOUNTER — Other Ambulatory Visit (HOSPITAL_COMMUNITY): Payer: No Typology Code available for payment source

## 2011-09-26 ENCOUNTER — Other Ambulatory Visit (HOSPITAL_COMMUNITY): Payer: No Typology Code available for payment source

## 2011-09-28 ENCOUNTER — Other Ambulatory Visit (HOSPITAL_COMMUNITY): Payer: No Typology Code available for payment source

## 2015-09-21 ENCOUNTER — Inpatient Hospital Stay (HOSPITAL_COMMUNITY)
Admission: EM | Admit: 2015-09-21 | Discharge: 2015-09-24 | DRG: 896 | Disposition: A | Discharge status: Home / Self Care | Payer: BLUE CROSS/BLUE SHIELD | Attending: Family Medicine | Admitting: Family Medicine

## 2015-09-21 ENCOUNTER — Emergency Department (HOSPITAL_COMMUNITY): Payer: BLUE CROSS/BLUE SHIELD

## 2015-09-21 ENCOUNTER — Encounter (HOSPITAL_COMMUNITY): Payer: Self-pay | Admitting: Emergency Medicine

## 2015-09-21 DIAGNOSIS — I10 Essential (primary) hypertension: Secondary | ICD-10-CM | POA: Diagnosis present

## 2015-09-21 DIAGNOSIS — Z880 Allergy status to penicillin: Secondary | ICD-10-CM | POA: Diagnosis not present

## 2015-09-21 DIAGNOSIS — F10239 Alcohol dependence with withdrawal, unspecified: Secondary | ICD-10-CM | POA: Diagnosis not present

## 2015-09-21 DIAGNOSIS — Z881 Allergy status to other antibiotic agents status: Secondary | ICD-10-CM

## 2015-09-21 DIAGNOSIS — G9340 Encephalopathy, unspecified: Secondary | ICD-10-CM

## 2015-09-21 DIAGNOSIS — F1020 Alcohol dependence, uncomplicated: Secondary | ICD-10-CM | POA: Diagnosis not present

## 2015-09-21 DIAGNOSIS — R0602 Shortness of breath: Secondary | ICD-10-CM

## 2015-09-21 DIAGNOSIS — Z79899 Other long term (current) drug therapy: Secondary | ICD-10-CM

## 2015-09-21 DIAGNOSIS — R229 Localized swelling, mass and lump, unspecified: Secondary | ICD-10-CM

## 2015-09-21 DIAGNOSIS — F17200 Nicotine dependence, unspecified, uncomplicated: Secondary | ICD-10-CM | POA: Diagnosis present

## 2015-09-21 DIAGNOSIS — F10931 Alcohol use, unspecified with withdrawal delirium: Secondary | ICD-10-CM | POA: Diagnosis present

## 2015-09-21 DIAGNOSIS — E871 Hypo-osmolality and hyponatremia: Secondary | ICD-10-CM | POA: Diagnosis present

## 2015-09-21 DIAGNOSIS — IMO0002 Reserved for concepts with insufficient information to code with codable children: Secondary | ICD-10-CM

## 2015-09-21 DIAGNOSIS — F411 Generalized anxiety disorder: Secondary | ICD-10-CM | POA: Diagnosis present

## 2015-09-21 DIAGNOSIS — F10231 Alcohol dependence with withdrawal delirium: Secondary | ICD-10-CM | POA: Diagnosis present

## 2015-09-21 DIAGNOSIS — R569 Unspecified convulsions: Secondary | ICD-10-CM | POA: Diagnosis present

## 2015-09-21 DIAGNOSIS — F10230 Alcohol dependence with withdrawal, uncomplicated: Secondary | ICD-10-CM | POA: Diagnosis not present

## 2015-09-21 DIAGNOSIS — F10939 Alcohol use, unspecified with withdrawal, unspecified: Secondary | ICD-10-CM

## 2015-09-21 LAB — MAGNESIUM: MAGNESIUM: 2.1 mg/dL (ref 1.7–2.4)

## 2015-09-21 LAB — COMPREHENSIVE METABOLIC PANEL
ALT: 54 U/L (ref 14–54)
AST: 62 U/L — AB (ref 15–41)
Albumin: 4.4 g/dL (ref 3.5–5.0)
Alkaline Phosphatase: 81 U/L (ref 38–126)
Anion gap: 9 (ref 5–15)
BUN: 9 mg/dL (ref 6–20)
CHLORIDE: 95 mmol/L — AB (ref 101–111)
CO2: 25 mmol/L (ref 22–32)
CREATININE: 0.48 mg/dL (ref 0.44–1.00)
Calcium: 9.5 mg/dL (ref 8.9–10.3)
GFR calc Af Amer: >60 mL/min (ref >60)
GFR calc non Af Amer: >60 mL/min (ref >60)
Glucose, Bld: 117 mg/dL — ABNORMAL HIGH (ref 65–99)
Potassium: 4.1 mmol/L (ref 3.5–5.1)
SODIUM: 129 mmol/L — AB (ref 135–145)
Total Bilirubin: 0.9 mg/dL (ref 0.3–1.2)
Total Protein: 7.8 g/dL (ref 6.5–8.1)

## 2015-09-21 LAB — CBC
HCT: 38.5 % (ref 36.0–46.0)
Hemoglobin: 13.2 g/dL (ref 12.0–15.0)
MCH: 32.4 pg (ref 26.0–34.0)
MCHC: 34.3 g/dL (ref 30.0–36.0)
MCV: 94.4 fL (ref 78.0–100.0)
PLATELETS: 277 10*3/uL (ref 150–400)
RBC: 4.08 MIL/uL (ref 3.87–5.11)
RDW: 13.3 % (ref 11.5–15.5)
WBC: 5.5 10*3/uL (ref 4.0–10.5)

## 2015-09-21 LAB — TSH: TSH: 1.718 u[IU]/mL (ref 0.350–4.500)

## 2015-09-21 LAB — URINALYSIS, ROUTINE W REFLEX MICROSCOPIC
BILIRUBIN URINE: NEGATIVE
GLUCOSE, UA: NEGATIVE mg/dL
HGB URINE DIPSTICK: NEGATIVE
Ketones, ur: 15 mg/dL — AB
LEUKOCYTES UA: NEGATIVE
Nitrite: NEGATIVE
PH: 6.5 (ref 5.0–8.0)
PROTEIN: NEGATIVE mg/dL
SPECIFIC GRAVITY, URINE: 1.019 (ref 1.005–1.030)

## 2015-09-21 LAB — RAPID URINE DRUG SCREEN, HOSP PERFORMED
AMPHETAMINES: NOT DETECTED
BENZODIAZEPINES: POSITIVE — AB
Barbiturates: NOT DETECTED
COCAINE: NOT DETECTED
OPIATES: NOT DETECTED
Tetrahydrocannabinol: NOT DETECTED

## 2015-09-21 LAB — AMMONIA: Ammonia: 19 umol/L (ref 9–35)

## 2015-09-21 MED ORDER — ENOXAPARIN SODIUM 30 MG/0.3ML ~~LOC~~ SOLN
20.0000 mg | SUBCUTANEOUS | Status: DC
  Administered 2015-09-21: 20 mg via SUBCUTANEOUS
  Filled 2015-09-21: qty 0.2

## 2015-09-21 MED ORDER — ONDANSETRON HCL 4 MG/2ML IJ SOLN: 4.0000 mg | Freq: Four times a day (QID) | INTRAMUSCULAR | Status: DC | PRN

## 2015-09-21 MED ORDER — ACETAMINOPHEN 650 MG RE SUPP: 650.0000 mg | Freq: Four times a day (QID) | RECTAL | Status: DC | PRN

## 2015-09-21 MED ORDER — DIAZEPAM 5 MG PO TABS
5.0000 mg | ORAL_TABLET | Freq: Three times a day (TID) | ORAL | Status: DC
  Administered 2015-09-21 – 2015-09-24 (×8): 5 mg via ORAL
  Filled 2015-09-21 (×9): qty 1

## 2015-09-21 MED ORDER — FOLIC ACID 1 MG PO TABS
1.0000 mg | ORAL_TABLET | Freq: Every day | ORAL | Status: DC
  Administered 2015-09-21 – 2015-09-24 (×4): 1 mg via ORAL
  Filled 2015-09-21 (×4): qty 1

## 2015-09-21 MED ORDER — ENOXAPARIN SODIUM 40 MG/0.4ML ~~LOC~~ SOLN: 40.0000 mg | SUBCUTANEOUS | Status: DC

## 2015-09-21 MED ORDER — SODIUM CHLORIDE 0.9 % IV SOLN
INTRAVENOUS | Status: DC
  Administered 2015-09-21 – 2015-09-22 (×2): via INTRAVENOUS

## 2015-09-21 MED ORDER — THIAMINE HCL 100 MG/ML IJ SOLN
100.0000 mg | Freq: Every day | INTRAMUSCULAR | Status: DC
  Filled 2015-09-21 (×4): qty 1

## 2015-09-21 MED ORDER — LORAZEPAM 2 MG/ML IJ SOLN
INTRAMUSCULAR | Status: AC
  Administered 2015-09-21: 2 mg
  Filled 2015-09-21: qty 1

## 2015-09-21 MED ORDER — LORAZEPAM 2 MG/ML IJ SOLN
1.0000 mg | Freq: Four times a day (QID) | INTRAMUSCULAR | Status: DC | PRN
  Administered 2015-09-22 – 2015-09-23 (×2): 1 mg via INTRAVENOUS
  Filled 2015-09-21 (×2): qty 1

## 2015-09-21 MED ORDER — ONDANSETRON HCL 4 MG PO TABS: 4.0000 mg | ORAL_TABLET | Freq: Four times a day (QID) | ORAL | Status: DC | PRN

## 2015-09-21 MED ORDER — ADULT MULTIVITAMIN W/MINERALS CH
1.0000 | ORAL_TABLET | Freq: Every day | ORAL | Status: DC
  Administered 2015-09-21 – 2015-09-24 (×4): 1 via ORAL
  Filled 2015-09-21 (×4): qty 1

## 2015-09-21 MED ORDER — ACETAMINOPHEN 325 MG PO TABS: 650.0000 mg | ORAL_TABLET | Freq: Four times a day (QID) | ORAL | Status: DC | PRN

## 2015-09-21 MED ORDER — VITAMIN B-1 100 MG PO TABS
100.0000 mg | ORAL_TABLET | Freq: Every day | ORAL | Status: DC
  Administered 2015-09-21 – 2015-09-24 (×4): 100 mg via ORAL
  Filled 2015-09-21 (×4): qty 1

## 2015-09-21 MED ORDER — SODIUM CHLORIDE 0.9 % IJ SOLN
3.0000 mL | Freq: Two times a day (BID) | INTRAMUSCULAR | Status: DC
  Administered 2015-09-23: 3 mL via INTRAVENOUS

## 2015-09-21 MED ORDER — LORAZEPAM 1 MG PO TABS
1.0000 mg | ORAL_TABLET | Freq: Four times a day (QID) | ORAL | Status: DC | PRN
  Administered 2015-09-22 (×2): 1 mg via ORAL
  Filled 2015-09-21 (×2): qty 1

## 2015-09-21 NOTE — ED Notes (Signed)
Pt. Is unable to urinate at this time.  

## 2015-09-21 NOTE — Clinical Social Work Note (Signed)
Clinical Social Work Assessment  Patient Details  Name: Brittany Powell MRN: [id number] Date of Birth: 1956-04-06  Date of referral:  09/21/15               Reason for consult:   (Patienit is from [facility])                Permission sought to share information with:   (None.) Permission granted to share information::  No  Name::        Agency::     Relationship::     Contact Information:     Housing/Transportation Living arrangements for the past 2 months:   ([facility]. However, usually lives with her husband in [place].) Source of Information:  Patient Patient Interpreter Needed:  None Criminal Activity/Legal Involvement Pertinent to Current Situation/Hospitalization:  No - Comment as needed Significant Relationships:  Adult Children, Spouse Lives with:  Spouse Do you feel safe going back to the place where you live?  Yes Need for family participation in patient care:  Yes (Comment) (Patient states her family is her primary support)  Care giving concerns:  There are no care giving concerns at this time. Patient states she can complete ADL's independently.    Social Worker assessment / plan:  CSW met with patient at bedside. Daughter was present. Patient confirms that she is from [facility].  Per note, patient presents to [facility] due to confusion and hyponatremia. Daughter states that patient was BIB ambulance. Also, daughter states that patient was at [facility] for 5 days. However, the rehab is suppose to last for 28 days.  Patient informed CSW that she completes ADL's independently. Patient informed CSW that she lives in [place] with her husband. Patient states that she has fallen x4 within 6 months due to alcohol and not wearing her glasses.    Patient informed CSW that she has a good support system, which consist of her children and husband.  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 However, she was only there for 5. Patient states [facility].)  Patient/Family's Response to care:  Patient is aware that she will be admitted and is accepting at this time. Patient and daughter are appropriate.   Patient/Family's Understanding of and Emotional Response to Diagnosis, Current Treatment, and Prognosis:  Patient and daughter state they have no questions for CSW.  Daughter/ Tonnette Zwiebel [phone] Tom Mcgourty/ Husband [phone]  Emotional Assessment Appearance:  Appears stated age Attitude/Demeanor/Rapport:   (Appropriate.) Affect (typically observed):  Accepting, Appropriate Orientation:  Oriented to Self, Oriented to Place, Oriented to  Time, Oriented to Situation Alcohol / Substance use:  Alcohol Use ([facility].) Psych involvement (Current and /or in the community):  No (Comment)  Discharge Needs  Concerns to be addressed:  Adjustment to Illness Readmission within the last 30 days:  No Current discharge risk:  None Barriers to Discharge:  No Barriers Identified   Bernita Buffy, LCSW 09/21/2015, 8:34 PM

## 2015-09-21 NOTE — ED Notes (Signed)
Pt. Is still unable to urinate at this time. 

## 2015-09-21 NOTE — ED Notes (Signed)
Heart healthy tray given to patient

## 2015-09-21 NOTE — ED Provider Notes (Signed)
CSN: [id number]     Arrival date & time 09/21/15  1114 History   First MD Initiated Contact with Patient 09/21/15 1208     Chief Complaint  Patient presents with  . Altered Mental Status  . hyponatremia      (Consider location/radiation/quality/duration/timing/severity/associated sxs/prior Treatment) HPI Comments: Sx started after pt stopped etoh w/drawl meds that she had been on for 6 days No fever, cough, dyspnea Sent her for eval  Patient is a 60 y.o. female presenting with altered mental status. The history is provided by the patient and a relative.  Altered Mental Status Presenting symptoms: confusion and disorientation   Severity:  Moderate Most recent episode:  Yesterday Episode history:  Single Duration:  1 hour Timing:  Sporadic Progression:  Waxing and waning Chronicity:  New   Past Medical History  Diagnosis Date  . Alcohol abuse    Past Surgical History  Procedure Laterality Date  . Arm surgery     No family history on file. Social History  Substance Use Topics  . Smoking status: Current Every Day Smoker  . Smokeless tobacco: None  . Alcohol Use: Yes     Comment: heavily   OB History    No data available     Review of Systems  Psychiatric/Behavioral: Positive for confusion.  All other systems reviewed and are negative.     Allergies  Cleocin; Doxycycline; and Penicillins  Home Medications   Prior to Admission medications   Medication Sig Start Date End Date Taking? Authorizing Provider  estrogen, conjugated,-medroxyprogesterone (PREMPRO) 0.45-1.5 MG per tablet Take 1 tablet by mouth daily.      Historical Provider, MD  Multiple Vitamins-Minerals (MULTIVITAMINS THER. W/MINERALS) TABS Take 1 tablet by mouth daily.      Historical Provider, MD  venlafaxine (EFFEXOR-XR) 75 MG 24 hr capsule Take 75 mg by mouth 2 (two) times daily.      Historical Provider, MD   BP 189/88 mmHg  Pulse 65  Temp(Src) 97.8 F (36.6 C) (Oral)  Resp 16  SpO2  100% Physical Exam  Constitutional: She is oriented to person, place, and time. She appears well-developed and well-nourished.  Non-toxic appearance. No distress.  HENT:  Head: Normocephalic and atraumatic.  Eyes: Conjunctivae, EOM and lids are normal. Pupils are equal, round, and reactive to light.  Neck: Normal range of motion. Neck supple. No tracheal deviation present. No thyroid mass present.  Cardiovascular: Normal rate, regular rhythm and normal heart sounds.  Exam reveals no gallop.   No murmur heard. Pulmonary/Chest: Effort normal and breath sounds normal. No stridor. No respiratory distress. She has no decreased breath sounds. She has no wheezes. She has no rhonchi. She has no rales.  Abdominal: Soft. Normal appearance and bowel sounds are normal. She exhibits no distension. There is no tenderness. There is no rebound and no CVA tenderness.  Musculoskeletal: Normal range of motion. She exhibits no edema or tenderness.  Neurological: She is alert and oriented to person, place, and time. She has normal strength. No cranial nerve deficit or sensory deficit. GCS eye subscore is 4. GCS verbal subscore is 5. GCS motor subscore is 6.  Skin: Skin is warm and dry. No abrasion and no rash noted.  Psychiatric: She has a normal mood and affect. Her speech is normal and behavior is normal.  Nursing note and vitals reviewed.   ED Course  Procedures (including critical care time) Labs Review Labs Reviewed  COMPREHENSIVE METABOLIC PANEL - Abnormal; Notable for the following:  Sodium 129 (*)    Chloride 95 (*)    Glucose, Bld 117 (*)    AST 62 (*)    All other components within normal limits  URINE CULTURE  CBC  AMMONIA  URINALYSIS, ROUTINE W REFLEX MICROSCOPIC (NOT AT [facility])  CBG MONITORING, ED    Imaging Review No results found. I have personally reviewed and evaluated these images and lab results as part of my medical decision-making.   EKG Interpretation None      MDM    Final diagnoses:  None    Patient monitor here and had a witnessed tonic-clonic seizure lasting for possibly 2 minutes which subsided. Was given Ativan 2 mg IV post. Was monitored and began her sensorium and is now alert and oriented 4. Patient did consume 20 beers per day prior to her detox 6 days agosuspect her current symptoms might be due to latent DTs. Will admit to stepdown   CRITICAL CARE Performed by: Toy Baker Total critical care time: 45 minutes Critical care time was exclusive of separately billable procedures and treating other patients. Critical care was necessary to treat or prevent imminent or life-threatening deterioration. Critical care was time spent personally by me on the following activities: development of treatment plan with patient and/or surrogate as well as nursing, discussions with consultants, evaluation of patient's response to treatment, examination of patient, obtaining history from patient or surrogate, ordering and performing treatments and interventions, ordering and review of laboratory studies, ordering and review of radiographic studies, pulse oximetry and re-evaluation of patient's condition.     Lorre Nick, MD 09/21/15 [phone]

## 2015-09-21 NOTE — ED Notes (Addendum)
Per EMS [facility] for [facility] detox sent for evaluation for confusion and hyponatremia; alert and oriented x4 on assessment.

## 2015-09-21 NOTE — ED Notes (Signed)
Nurse drawing labs. 

## 2015-09-21 NOTE — ED Notes (Signed)
Pt. Is unable to urinate asked several times.

## 2015-09-21 NOTE — ED Notes (Signed)
Patient had a seizure.  Seizure pads on bed

## 2015-09-21 NOTE — H&P (Addendum)
[facility] History and Physical  Maryfrances BunnellMichele Tuft ZOX:[id number] DOB: [birth year] DOA: 09/21/2015  Referring physician: Dr Brigid ReAlllen PCP: Evette GeorgesDD,JEFFREY ALLEN, MD   Chief Complaint:  Seizures and acute encephaloapthy  HPI:  [age] female with alcohol abuse (drinks 20 beers a day), tobacco use who was at [facility] for alcohol detox for the past 5 days was sent to the ED for altered mental status since yesterday. History of pain from ED physician and daughter at bedside. As per daughter patient was increasingly confused since yesterday evening. Her medications show that she is on tapering dose of Ativan. Patient has been at the detox center for the past 5 days. Patient reports that she may have fallen in the bathroom at the facility. In the ED Patient was hypertensive with blood pressure of 194/84 mmHg. she had about 2 minute long witnessed tonic-clonic seizures which self subsided. She was given 2 mg IV Ativan. Her mentation improved to baseline while in the ED. Blood work done showed hyponatremia with sodium of 129. Remaining labs are normal AST was 62. No history of alcohol withdrawal symptoms, seizures, DTs. She has not been hospitalized for alcohol withdrawal symptoms. Patient is weak and fatigued on my examination but is able to answer questions appropriately. She denies any headache, dizziness, nausea, vomiting, blurred vision, fevers, chills, chest pain, palpitations, shortness of breath, abdominal pain, bowel or urinary symptoms. She denies visual or tactile hallucinations.   Chest x-ray and head CT done in the ED were unremarkable. She had an x-ray of her ribs done with finding of questionable nodular density on chest x-ray but appeared to be from a bony origin.  Hospitalists admission requested to stepdown. Patient is hemodynamically stable. Improvement in her mentation so decided to admit her to telemetry unit.  Review of Systems:  Constitutional: Denies fever, chills, diaphoresis,  appetite change and fatigue.  HEENT: Denies visual or hearing symptoms, congestion, sore throat, difficulty swallowing, neck pain or stiffness Respiratory: Denies SOB, DOE, cough, chest tightness,  and wheezing.   Cardiovascular: Denies chest pain, palpitations and leg swelling.  Gastrointestinal: Denies nausea, vomiting, abdominal pain, diarrhea, constipation, blood in stool and abdominal distention.  Genitourinary: Denies dysuria, urgency, frequency, hematuria, flank pain and difficulty urinating.  Endocrine: Denies: hot or cold intolerance, sweats,  polyuria, polydipsia. Musculoskeletal: Denies myalgias, back pain, joint swelling, arthralgias and gait problem.  Skin: Denies pallor, rash and wound.  Neurological: Seizures, confusion, Denies dizziness, syncope, weakness, light-headedness, numbness and headaches.  Hematological: Denies adenopathy.  Psychiatric/Behavioral: Confusion  Past Medical History  Diagnosis Date  . Alcohol abuse    Past Surgical History  Procedure Laterality Date  . Arm surgery     Social History:  reports that she has been smoking.  She does not have any smokeless tobacco history on file. She reports that she drinks alcohol. She reports that she uses illicit drugs (Marijuana).  Allergies  Allergen Reactions  . Cleocin [Clindamycin Hcl] Anaphylaxis    Took topical cleocin for eczema and had anaphylaxis  . Doxycycline   . Penicillins     Has patient had a PCN reaction causing immediate rash, facial/tongue/throat swelling, SOB or lightheadedness with hypotension: Yes Has patient had a PCN reaction causing severe rash involving mucus membranes or skin necrosis: Yes Has patient had a PCN reaction that required hospitalization No Has patient had a PCN reaction occurring within the last 10 years: No If all of the above answers are "NO", then may proceed with Cephalosporin use.    Family history:  No family history of heart disease or seizures.  Prior to  Admission medications   Medication Sig Start Date End Date Taking? Authorizing Provider  ALPRAZolam Prudy Feeler) 0.5 MG tablet Take 0.5 mg by mouth at bedtime as needed for anxiety or sleep.   Yes Historical Provider, MD  DiazePAM 10 MG/2ML SOAJ Inject 10 mg into the muscle daily as needed (seizures).   Yes Historical Provider, MD  FLUARIX QUADRIVALENT 0.5 ML injection Inject 1 Dose as directed once. 06/16/15  Yes Historical Provider, MD  ibuprofen (ADVIL,MOTRIN) 200 MG tablet Take 400 mg by mouth every 6 (six) hours as needed for headache or moderate pain.   Yes Historical Provider, MD  LORazepam (ATIVAN) 0.5 MG tablet Take 0.5 mg by mouth 4 (four) times daily. Take 2mg  4 times daily for 4 doses, take 1.5mg  4 times daily for 4 doses, take 1mg  4 times daily for 4 doses, take 0.5mg  4 times daily for 4 doses   Yes Historical Provider, MD  Multiple Vitamins-Minerals (MULTIVITAMINS THER. W/MINERALS) TABS Take 1 tablet by mouth daily.     Yes Historical Provider, MD  PRESCRIPTION MEDICATION Inject 2 mLs as directed once. b1 injection on entrance to detox program   Yes Historical Provider, MD  thiamine 100 MG tablet Take 100 mg by mouth daily.   Yes Historical Provider, MD     Physical Exam:  Filed Vitals:   09/21/15 1124 09/21/15 1334 09/21/15 1430  BP: 189/88 194/84 194/99  Pulse: 65 61 64  Temp: 97.8 F (36.6 C) 98.2 F (36.8 C)   TempSrc: Oral Oral   Resp: 16 16 16   SpO2: 100% 100% 99%    Constitutional: Vital signs reviewed.  Middle aged female lying in bed appears fatigued, not in distress HEENT: no pallor, no icterus, moist oral mucosa, no cervical lymphadenopathy Cardiovascular: RRR, S1 normal, S2 normal, no MRG Chest: CTAB, no wheezes, rales, or rhonchi Abdominal: Soft. Non-tender, non-distended, bowel sounds are normal,  Ext: warm, no edema Neurological: Appears sleepy but easily arousable and oriented, fine tremors.  Labs on Admission:  Basic Metabolic Panel:  Recent Labs Lab  09/21/15 1132  NA 129*  K 4.1  CL 95*  CO2 25  GLUCOSE 117*  BUN 9  CREATININE 0.48  CALCIUM 9.5   Liver Function Tests:  Recent Labs Lab 09/21/15 1132  AST 62*  ALT 54  ALKPHOS 81  BILITOT 0.9  PROT 7.8  ALBUMIN 4.4   No results for input(s): LIPASE, AMYLASE in the last 168 hours.  Recent Labs Lab 09/21/15 1310  AMMONIA 19   CBC:  Recent Labs Lab 09/21/15 1132  WBC 5.5  HGB 13.2  HCT 38.5  MCV 94.4  PLT 277   Cardiac Enzymes: No results for input(s): CKTOTAL, CKMB, CKMBINDEX, TROPONINI in the last 168 hours. BNP: Invalid input(s): POCBNP CBG: No results for input(s): GLUCAP in the last 168 hours.  Radiological Exams on Admission: Dg Chest 2 View  09/21/2015  CLINICAL DATA:  Shortness of breath, patient smokes EXAM: CHEST  2 VIEW COMPARISON:  None FINDINGS: The heart size and vascular pattern are normal. There is no consolidation or effusion. There is a rounded opacity measuring about 1.5 cm over the right upper lobe. There is another similar oval opacity measuring 2 cm over the right lower lobe. It is possible that these both represent hyper trophic rib margins. IMPRESSION: Suggest either CT thorax or rib series to determine whether the nodular opacities may represent hyper trophic rib margins versus  pulmonary mass lesions. These results will be called to the ordering clinician or representative by the Radiologist Assistant, and communication documented in the PACS or zVision Dashboard. Electronically Signed   By: Esperanza Heir M.D.   On: 09/21/2015 13:39   Dg Ribs Unilateral Right  09/21/2015  CLINICAL DATA:  Nodular opacities seen in the right lung on a chest x-ray from earlier today. EXAM: RIGHT RIBS - 2 VIEW COMPARISON:  Chest x-ray from earlier today FINDINGS: The nodular density projected over the right lower lobe, measuring 2 cm, on the recent chest x-ray correlates with the posterior right ninth rib on all images. The 1.5 cm nodular density on the  recent chest x-ray over the upper lobe is not as well evaluated on the rib films. On the initial AP view, the opacity appears to correlate with the right second costochondral junction. The finding is not seen on the second image however. No other bony lesions are identified. No other pulmonary opacities are noted. IMPRESSION: 1. The 2 cm nodular density projected over the right lower lobe appears to correlate with the posterior right ninth rib on all images. This finding is favored to be bony in origin, probably a callus from a previous fracture. 2. The nodular density projected over the right upper lobe is not as well seen on the rib films but may have been associated with the right second costochondral junction. Electronically Signed   By: Gerome Sam III M.D   On: 09/21/2015 14:54   Ct Head Wo Contrast  09/21/2015  CLINICAL DATA:  Altered mental status. Confusion. Hyponatremia. Alcohol abuse. EXAM: CT HEAD WITHOUT CONTRAST TECHNIQUE: Contiguous axial images were obtained from the base of the skull through the vertex without intravenous contrast. COMPARISON:  None. FINDINGS: No evidence of parenchymal hemorrhage or extra-axial fluid collection. No mass lesion, mass effect, or midline shift. No CT evidence of acute infarction. Nonspecific mild subcortical and periventricular white matter hypodensity, most in keeping with chronic small vessel ischemic change. Cerebral volume is age appropriate. No ventriculomegaly. Partial opacification of the visualized right maxillary sinus by inspissated secretions. The mastoid air cells are unopacified. No evidence of calvarial fracture. IMPRESSION: 1.  No evidence of acute intracranial abnormality. 2. Nonspecific mild subcortical and periventricular white matter hypodensity, most suggestive of chronic small vessel ischemic change. 3. Limited visualization of right maxillary sinusitis. Electronically Signed   By: Delbert Phenix M.D.   On: 09/21/2015 13:34    EKG:  Pending  Assessment/Plan  Principal Problem: Acute seizures with encephalopathy Suspected to be in the setting of alcohol withdrawal although her last drink was 5 days back. Patient sent to the ED from alcohol detox center for confusion since one day. Patient has elevated blood pressure at presentation and is likely going into early DTs. Vitals otherwise stable. -Monitor on telemetry. Seizure precautions. Started on CIWA protocol. I placed her on scheduled Valium. -It is likely that patient may have been tapered off Ativan too early at the facility. -Head CT on admission unremarkable. Check UA, urine drug screen and TSH. Check magnesium. -Order thiamine, folate and multivitamin.  Active Problems:   TOBACCO ABUSE Needs counseling on cessation. Smokes more than one pack a day. Ordered nicotine patch.   hyponatremia Possibly in the setting of dehydration. Monitor with IV fluids.       Diet:regular  DVT prophylaxis: sq lovenox   Code Status: full code Family Communication:discussed with daughter at bedside Disposition Plan: Admit to telemetry.  Eddie North [facility] Pager  [phone]  Total time spent on admission :70 minutes  If 7PM-7AM, please contact night-coverage [email or web address] Password [facility] 09/21/2015, 3:47 PM

## 2015-09-21 NOTE — ED Notes (Signed)
Called to give to report, Jenkins RougeNakiyha, RCharity fundraiser

## 2015-09-22 DIAGNOSIS — F411 Generalized anxiety disorder: Secondary | ICD-10-CM

## 2015-09-22 LAB — URINE CULTURE: Culture: 1000

## 2015-09-22 LAB — BASIC METABOLIC PANEL
ANION GAP: 10 (ref 5–15)
BUN: 8 mg/dL (ref 6–20)
CALCIUM: 8.7 mg/dL — AB (ref 8.9–10.3)
CO2: 24 mmol/L (ref 22–32)
Chloride: 101 mmol/L (ref 101–111)
Creatinine, Ser: 0.52 mg/dL (ref 0.44–1.00)
GFR calc Af Amer: >60 mL/min (ref >60)
GLUCOSE: 92 mg/dL (ref 65–99)
Potassium: 3.7 mmol/L (ref 3.5–5.1)
SODIUM: 135 mmol/L (ref 135–145)

## 2015-09-22 LAB — GLUCOSE, CAPILLARY: GLUCOSE-CAPILLARY: 105 mg/dL — AB (ref 65–99)

## 2015-09-22 MED ORDER — ENOXAPARIN SODIUM 40 MG/0.4ML ~~LOC~~ SOLN
40.0000 mg | SUBCUTANEOUS | Status: DC
  Administered 2015-09-22 – 2015-09-23 (×2): 40 mg via SUBCUTANEOUS
  Filled 2015-09-22 (×3): qty 0.4

## 2015-09-22 MED ORDER — SODIUM CHLORIDE 0.9 % IV SOLN
INTRAVENOUS | Status: DC
  Administered 2015-09-22 – 2015-09-23 (×4): via INTRAVENOUS

## 2015-09-22 NOTE — Progress Notes (Signed)
[facility] PROGRESS NOTE  Brittany Powell ZOX:[id number] DOB: 1956-08-10 DOA: 09/21/2015 PCP: Evette Georges, MD  Brief narrative 60 year old female with alcohol Abuse who was at [facility] for alcohol detox for the past 5 days was sent to the ED for her mental status for 1 day duration. In the ED patient had witnessed tonic-clonic seizures with several subsided. Labs showed sodium of 129. Patient admitted for alcohol withdrawal seizures/early DTs.  Assessment/Plan:  Principal Problem: Acute seizures with encephalopathy Suspected to be in the setting of alcohol withdrawal although her last drink was 5 days back. Patient sent to the ED from alcohol detox center for confusion since one day. Patient has elevated blood pressure and some tremors on exam. Her encephalopathy has resolved. -Monitor on telemetry for another 24 hours. Continue IV fluids. Seizure precautions.  on CIWA protocol and placed her on scheduled Valium. -It is likely that patient may have been tapered off Ativan too early at the facility. -Head CT on admission unremarkable.  UA, urine drug screen , TSH and magnesium unremarkable -Continue thiamine, folate and multivitamin. -If stable tomorrow she can be discharged home with slow Ativan or Librium taper.  Active Problems:  TOBACCO ABUSE Counseled on cessation. Smokes more than one pack a day. Ordered nicotine patch.  hyponatremia Possibly in the setting of dehydration. Improved with fluids.  Code Status: Full code Family Communication: None at bedside Disposition Plan: Discharge home tomorrow if symptoms continue to improve   Consultants:  None  Procedures:  None  Antibiotics:  None  HPI/Subjective: Seen and examined. Denies any specific symptoms. Objective: Filed Vitals:   09/22/15 0012 09/22/15 0537  BP: 165/79 158/112  Pulse:  71  Temp:  98 F (36.7 C)  Resp:  16    Intake/Output Summary (Last 24 hours) at 09/22/15 1457 Last data  filed at 09/22/15 1030  Gross per 24 hour  Intake 1451.67 ml  Output   1000 ml  Net 451.67 ml   Filed Weights   09/22/15 0600  Weight: 56.5 kg (124 lb 9 oz)    Exam:   General:  Middle aged female not in distress  HEENT: No pallor, moist mucosa  Chest: Clear bilaterally  CVS: Normal S1 and S2, no murmurs  GI: Soft, nondistended, nontender,  Musculoskeletal: Warm, no edema  CNS: Alert and oriented, fine tremors  Data Reviewed: Basic Metabolic Panel:  Recent Labs Lab 09/21/15 1132 09/22/15 0525  NA 129* 135  K 4.1 3.7  CL 95* 101  CO2 25 24  GLUCOSE 117* 92  BUN 9 8  CREATININE 0.48 0.52  CALCIUM 9.5 8.7*  MG 2.1  --    Liver Function Tests:  Recent Labs Lab 09/21/15 1132  AST 62*  ALT 54  ALKPHOS 81  BILITOT 0.9  PROT 7.8  ALBUMIN 4.4   No results for input(s): LIPASE, AMYLASE in the last 168 hours.  Recent Labs Lab 09/21/15 1310  AMMONIA 19   CBC:  Recent Labs Lab 09/21/15 1132  WBC 5.5  HGB 13.2  HCT 38.5  MCV 94.4  PLT 277   Cardiac Enzymes: No results for input(s): CKTOTAL, CKMB, CKMBINDEX, TROPONINI in the last 168 hours. BNP (last 3 results) No results for input(s): BNP in the last 8760 hours.  ProBNP (last 3 results) No results for input(s): PROBNP in the last 8760 hours.  CBG:  Recent Labs Lab 09/21/15 1214  GLUCAP 105*    Recent Results (from the past 240 hour(s))  Urine culture  Status: None   Collection Time: 09/21/15  4:46 PM  Result Value Ref Range Status   Specimen Description URINE, RANDOM  Final   Special Requests NONE  Final   Culture   Final    1,000 COLONIES/mL INSIGNIFICANT GROWTH Performed at [facility]    Report Status 09/22/2015 FINAL  Final     Studies: Dg Chest 2 View  09/21/2015  CLINICAL DATA:  Shortness of breath, patient smokes EXAM: CHEST  2 VIEW COMPARISON:  None FINDINGS: The heart size and vascular pattern are normal. There is no consolidation or effusion. There is a  rounded opacity measuring about 1.5 cm over the right upper lobe. There is another similar oval opacity measuring 2 cm over the right lower lobe. It is possible that these both represent hyper trophic rib margins. IMPRESSION: Suggest either CT thorax or rib series to determine whether the nodular opacities may represent hyper trophic rib margins versus pulmonary mass lesions. These results will be called to the ordering clinician or representative by the Radiologist Assistant, and communication documented in the PACS or zVision Dashboard. Electronically Signed   By: Esperanza Heir M.D.   On: 09/21/2015 13:39   Dg Ribs Unilateral Right  09/21/2015  CLINICAL DATA:  Nodular opacities seen in the right lung on a chest x-ray from earlier today. EXAM: RIGHT RIBS - 2 VIEW COMPARISON:  Chest x-ray from earlier today FINDINGS: The nodular density projected over the right lower lobe, measuring 2 cm, on the recent chest x-ray correlates with the posterior right ninth rib on all images. The 1.5 cm nodular density on the recent chest x-ray over the upper lobe is not as well evaluated on the rib films. On the initial AP view, the opacity appears to correlate with the right second costochondral junction. The finding is not seen on the second image however. No other bony lesions are identified. No other pulmonary opacities are noted. IMPRESSION: 1. The 2 cm nodular density projected over the right lower lobe appears to correlate with the posterior right ninth rib on all images. This finding is favored to be bony in origin, probably a callus from a previous fracture. 2. The nodular density projected over the right upper lobe is not as well seen on the rib films but may have been associated with the right second costochondral junction. Electronically Signed   By: Gerome Sam III M.D   On: 09/21/2015 14:54   Ct Head Wo Contrast  09/21/2015  CLINICAL DATA:  Altered mental status. Confusion. Hyponatremia. Alcohol abuse. EXAM:  CT HEAD WITHOUT CONTRAST TECHNIQUE: Contiguous axial images were obtained from the base of the skull through the vertex without intravenous contrast. COMPARISON:  None. FINDINGS: No evidence of parenchymal hemorrhage or extra-axial fluid collection. No mass lesion, mass effect, or midline shift. No CT evidence of acute infarction. Nonspecific mild subcortical and periventricular white matter hypodensity, most in keeping with chronic small vessel ischemic change. Cerebral volume is age appropriate. No ventriculomegaly. Partial opacification of the visualized right maxillary sinus by inspissated secretions. The mastoid air cells are unopacified. No evidence of calvarial fracture. IMPRESSION: 1.  No evidence of acute intracranial abnormality. 2. Nonspecific mild subcortical and periventricular white matter hypodensity, most suggestive of chronic small vessel ischemic change. 3. Limited visualization of right maxillary sinusitis. Electronically Signed   By: Delbert Phenix M.D.   On: 09/21/2015 13:34    Scheduled Meds: . diazepam  5 mg Oral 3 times per day  . enoxaparin (LOVENOX) injection  40 mg Subcutaneous Q24H  . folic acid  1 mg Oral Daily  . multivitamin with minerals  1 tablet Oral Daily  . sodium chloride  3 mL Intravenous Q12H  . thiamine  100 mg Oral Daily   Or  . thiamine  100 mg Intravenous Daily   Continuous Infusions: . sodium chloride 100 mL/hr at 09/22/15 0537      Time spent: 25 minutes    Brittany Powell  [facility] Pager [phone] If 7PM-7AM, please contact night-coverage at [email or web address], password [email or web address] 09/22/2015, 2:57 PM  LOS: 1 day

## 2015-09-22 NOTE — Clinical Social Work Note (Signed)
CSW met with pt at bedside to inquire about returning to [facility]. Pt states she doesn't want to return to [facility]; "that was the worst place I've ever been. I want to go home." CSW explained the dangers of returning home prior to attending residential tx [e.g., relapse] however pt is adamant about returning home. CSW will continue to follow for d/c needs. If pt changes her mind and would like to return to [facility], [facility] will facilitate placement.    Cindra Presume, LCSW [phone] Hospital psychiatric & 5E, 5W [phone] Licensed Clinical Social Worker

## 2015-09-23 DIAGNOSIS — F10230 Alcohol dependence with withdrawal, uncomplicated: Secondary | ICD-10-CM

## 2015-09-23 NOTE — Clinical Social Work Note (Signed)
CSW met with pt and family at bedside. Pt was tearful; family explained she is unable to return home until she finishes her treatment at [facility]. Pt is resistant to return but ultimately agrees. CSW will facilitate placement; CSW spoke with Katharine Look, Soil scientist at [facility] who states pt was medically d/c prior to this hospitalization and will be considered a new admission. CSW confirmed with attending that he will evaluate pt later today. CSW will fax out pt records to [facility] tomorrow morning as pt will not be accepted until medically cleared by MD and pt will not be accepted this afternoon [admission only happen in morning hours]- per Katharine Look.   Cindra Presume, LCSW [phone] Hospital psychiatric & 5E, 5W [phone] Licensed Clinical Social Worker

## 2015-09-23 NOTE — Progress Notes (Signed)
[facility] PROGRESS NOTE  Brittany Powell WUX:[id number] DOB: February 24, 1956 DOA: 09/21/2015 PCP: Evette Georges, MD  Brief narrative 60 year old female with alcohol Abuse who was at [facility] for alcohol detox for the past 5 days was sent to the ED for her mental status for 1 day duration. In the ED patient had witnessed tonic-clonic seizures with several subsided. Labs showed sodium of 129. Patient admitted for alcohol withdrawal seizures/early DTs.  Assessment/Plan:  Principal Problem: Acute seizures with encephalopathy Suspected to be in the setting of alcohol withdrawal although her last drink was 5 days back. Patient sent to the ED from alcohol detox center for confusion since one day. No seizure like activity reported today -Monitor on telemetry for another 24 hours. Continue IV fluids. Seizure precautions.  on CIWA protocol and placed her on scheduled Valium. -It is likely that patient may have been tapered off Ativan too early at the facility. -Head CT on admission unremarkable.  UA, urine drug screen , TSH and magnesium unremarkable -Continue thiamine, folate and multivitamin.  Active Problems:  TOBACCO ABUSE Counseled on cessation. Smokes more than one pack a day. Ordered nicotine patch.  hyponatremia Possibly in the setting of dehydration. Improved with fluids.  Code Status: Full code Family Communication: None at bedside Disposition Plan: Consider d/c home next am if continued improvement. Medically cleared for discharge but unable to place back into [facility] today.   Consultants:  None  Procedures:  None  Antibiotics:  None  HPI/Subjective: Pt has no new complaints. Feels better today   Objective: Filed Vitals:   09/23/15 0503 09/23/15 1500  BP: 157/79 166/78  Pulse: 55 54  Temp: 98.5 F (36.9 C) 97.9 F (36.6 C)  Resp: 16 18    Intake/Output Summary (Last 24 hours) at 09/23/15 1543 Last data filed at 09/23/15 1448  Gross per  24 hour  Intake 3653.33 ml  Output      0 ml  Net 3653.33 ml   Filed Weights   09/22/15 0600  Weight: 56.5 kg (124 lb 9 oz)    Exam:   General:  Middle aged female awake and alert and in nad  HEENT: No pallor, moist mucosa  Chest: Clear bilaterally, jno wheezes, equal chest rise.  CVS: Normal S1 and S2, no murmurs  GI: Soft, nondistended, nontender,  Musculoskeletal: Warm, no edema  CNS: Alert and oriented, fine tremors  Data Reviewed: Basic Metabolic Panel:  Recent Labs Lab 09/21/15 1132 09/22/15 0525  NA 129* 135  K 4.1 3.7  CL 95* 101  CO2 25 24  GLUCOSE 117* 92  BUN 9 8  CREATININE 0.48 0.52  CALCIUM 9.5 8.7*  MG 2.1  --    Liver Function Tests:  Recent Labs Lab 09/21/15 1132  AST 62*  ALT 54  ALKPHOS 81  BILITOT 0.9  PROT 7.8  ALBUMIN 4.4   No results for input(s): LIPASE, AMYLASE in the last 168 hours.  Recent Labs Lab 09/21/15 1310  AMMONIA 19   CBC:  Recent Labs Lab 09/21/15 1132  WBC 5.5  HGB 13.2  HCT 38.5  MCV 94.4  PLT 277   Cardiac Enzymes: No results for input(s): CKTOTAL, CKMB, CKMBINDEX, TROPONINI in the last 168 hours. BNP (last 3 results) No results for input(s): BNP in the last 8760 hours.  ProBNP (last 3 results) No results for input(s): PROBNP in the last 8760 hours.  CBG:  Recent Labs Lab 09/21/15 1214  GLUCAP 105*    Recent Results (from the  past 240 hour(s))  Urine culture     Status: None   Collection Time: 09/21/15  4:46 PM  Result Value Ref Range Status   Specimen Description URINE, RANDOM  Final   Special Requests NONE  Final   Culture   Final    1,000 COLONIES/mL INSIGNIFICANT GROWTH Performed at [facility]    Report Status 09/22/2015 FINAL  Final     Studies: No results found.  Scheduled Meds: . diazepam  5 mg Oral 3 times per day  . enoxaparin (LOVENOX) injection  40 mg Subcutaneous Q24H  . folic acid  1 mg Oral Daily  . multivitamin with minerals  1 tablet Oral Daily   . sodium chloride  3 mL Intravenous Q12H  . thiamine  100 mg Oral Daily   Or  . thiamine  100 mg Intravenous Daily   Continuous Infusions: . sodium chloride 100 mL/hr at 09/23/15 1448      Time spent: 25 minutes    Penny Pia  [facility] Pager [phone] If 7PM-7AM, please contact night-coverage at [email or web address], password [facility] 09/23/2015, 3:43 PM  LOS: 2 days

## 2015-09-24 DIAGNOSIS — R569 Unspecified convulsions: Secondary | ICD-10-CM

## 2015-09-24 DIAGNOSIS — F10239 Alcohol dependence with withdrawal, unspecified: Secondary | ICD-10-CM

## 2015-09-24 DIAGNOSIS — E871 Hypo-osmolality and hyponatremia: Secondary | ICD-10-CM

## 2015-09-24 MED ORDER — DIAZEPAM 5 MG PO TABS: 5.0000 mg | ORAL_TABLET | ORAL | Status: AC

## 2015-09-24 NOTE — Progress Notes (Signed)
Discharge instructions given to pt/daughter, verbalized understanding. Left the unit in stable condition. 

## 2015-09-24 NOTE — Clinical Social Work Note (Addendum)
8:20am- CSW faxed clinical information to [facility]. [facility] staff is expecting this information; CSW will call and confirm fax was received. Discharge is anticipated today.   9:50am- CSW confirmed with Emergency planning/management officer, Dois Davenport at [facility] that pt was denied admission due to medical acuity. CSW informed pt's daughter, Verdon Cummins who is searching for alternative placement. CSW informed pt who will contact family about returning home. CSW confirmed with attending that pt will be discharged today.    Etta Quill, LCSW [phone] Hospital psychiatric & 5E, 5W 31-35 Licensed Clinical Social Worker

## 2015-09-24 NOTE — Discharge Summary (Signed)
Physician Discharge Summary  Brittany Powell ZOX:[id number] DOB: [birth year] DOA: 09/21/2015  PCP: Evette Georges, MD  Admit date: 09/21/2015 Discharge date: 09/24/2015  Time spent: > 35 minutes  Recommendations for Outpatient Follow-up:  1. Will d/c on valium taper. Patient to f/u with pcp after discharge for further monitoring. Discussed with daughter   Discharge Diagnoses:  Principal Problem:   Alcohol withdrawal seizure (HCC) Active Problems:   Anxiety state   TOBACCO ABUSE   Alcohol dependence (HCC)   Delirium tremens (HCC)   Hyponatremia   Discharge Condition: stable  Diet recommendation: heart healthy  Filed Weights   09/22/15 0600  Weight: 56.5 kg (124 lb 9 oz)    History of present illness:  From original HPI: 74 female with alcohol abuse (drinks 20 beers a day), tobacco use who was at [facility] for alcohol detox for the past 5 days was sent to the ED for altered mental status since yesterday. History of pain from ED physician and daughter at bedside. As per daughter patient was increasingly confused since yesterday evening. Her medications show that she is on tapering dose of Ativan. Patient has been at the detox center for the past 5 days. Patient reports that she may have fallen in the bathroom at the facility. In the ED Patient was hypertensive with blood pressure of 194/84 mmHg. she had about 2 minute long witnessed tonic-clonic seizures which self subsided.   Hospital Course:  Acute seizures with encephalopathy - No seizure like activity reported today - Will d/c with valium taper -Head CT on admission unremarkable. UA, urine drug screen , TSH and magnesium unremarkable -Continue thiamine and multivitamin.  Active Problems:  TOBACCO ABUSE Counseled on cessation. Smokes more than one pack a day. Ordered nicotine patch.  hyponatremia Possibly in the setting of dehydration. Improved with  fluids.   Procedures:  None  Consultations:  None  Discharge Exam: Filed Vitals:   09/23/15 1500 09/23/15 2232  BP: 166/78 165/76  Pulse: 54 65  Temp: 97.9 F (36.6 C) 97.6 F (36.4 C)  Resp: 18 18    General: Pt in nad, alert and awake Cardiovascular: rrr, no mrg Respiratory: cta bl, no wheezes  Discharge Instructions   Discharge Instructions    Call MD for:  difficulty breathing, headache or visual disturbances    Complete by:  As directed      Call MD for:  temperature >100.4    Complete by:  As directed      Diet - low sodium heart healthy    Complete by:  As directed      Discharge instructions    Complete by:  As directed   Please follow up with your primary care physician in 1-2 weeks or sooner should any new concerns arise.     Increase activity slowly    Complete by:  As directed           Current Discharge Medication List    START taking these medications   Details  diazepam (VALIUM) 5 MG tablet Take 1 tablet (5 mg total) by mouth as directed. Take 5 mg po q 8 hours for the next 3 days, then take 5 mg po q 12 hours for the next 3 days, then take 5 mg po q day for the next 3 days then discontinue Qty: 18 tablet, Refills: 0      CONTINUE these medications which have NOT CHANGED   Details  DiazePAM 10 MG/2ML SOAJ Inject 10 mg into the muscle  daily as needed (seizures).    Multiple Vitamins-Minerals (MULTIVITAMINS THER. W/MINERALS) TABS Take 1 tablet by mouth daily.      thiamine 100 MG tablet Take 100 mg by mouth daily.      STOP taking these medications     ALPRAZolam (XANAX) 0.5 MG tablet      FLUARIX QUADRIVALENT 0.5 ML injection      ibuprofen (ADVIL,MOTRIN) 200 MG tablet      LORazepam (ATIVAN) 0.5 MG tablet      PRESCRIPTION MEDICATION        Allergies  Allergen Reactions  . Cleocin [Clindamycin Hcl] Anaphylaxis    Took topical cleocin for eczema and had anaphylaxis  . Doxycycline   . Penicillins     Has patient had a PCN  reaction causing immediate rash, facial/tongue/throat swelling, SOB or lightheadedness with hypotension: Yes Has patient had a PCN reaction causing severe rash involving mucus membranes or skin necrosis: Yes Has patient had a PCN reaction that required hospitalization No Has patient had a PCN reaction occurring within the last 10 years: No If all of the above answers are "NO", then may proceed with Cephalosporin use.       The results of significant diagnostics from this hospitalization (including imaging, microbiology, ancillary and laboratory) are listed below for reference.    Significant Diagnostic Studies: Dg Chest 2 View  09/21/2015  CLINICAL DATA:  Shortness of breath, patient smokes EXAM: CHEST  2 VIEW COMPARISON:  None FINDINGS: The heart size and vascular pattern are normal. There is no consolidation or effusion. There is a rounded opacity measuring about 1.5 cm over the right upper lobe. There is another similar oval opacity measuring 2 cm over the right lower lobe. It is possible that these both represent hyper trophic rib margins. IMPRESSION: Suggest either CT thorax or rib series to determine whether the nodular opacities may represent hyper trophic rib margins versus pulmonary mass lesions. These results will be called to the ordering clinician or representative by the Radiologist Assistant, and communication documented in the PACS or zVision Dashboard. Electronically Signed   By: Esperanza Heir M.D.   On: 09/21/2015 13:39   Dg Ribs Unilateral Right  09/21/2015  CLINICAL DATA:  Nodular opacities seen in the right lung on a chest x-ray from earlier today. EXAM: RIGHT RIBS - 2 VIEW COMPARISON:  Chest x-ray from earlier today FINDINGS: The nodular density projected over the right lower lobe, measuring 2 cm, on the recent chest x-ray correlates with the posterior right ninth rib on all images. The 1.5 cm nodular density on the recent chest x-ray over the upper lobe is not as well  evaluated on the rib films. On the initial AP view, the opacity appears to correlate with the right second costochondral junction. The finding is not seen on the second image however. No other bony lesions are identified. No other pulmonary opacities are noted. IMPRESSION: 1. The 2 cm nodular density projected over the right lower lobe appears to correlate with the posterior right ninth rib on all images. This finding is favored to be bony in origin, probably a callus from a previous fracture. 2. The nodular density projected over the right upper lobe is not as well seen on the rib films but may have been associated with the right second costochondral junction. Electronically Signed   By: Gerome Sam III M.D   On: 09/21/2015 14:54   Ct Head Wo Contrast  09/21/2015  CLINICAL DATA:  Altered mental status. Confusion.  Hyponatremia. Alcohol abuse. EXAM: CT HEAD WITHOUT CONTRAST TECHNIQUE: Contiguous axial images were obtained from the base of the skull through the vertex without intravenous contrast. COMPARISON:  None. FINDINGS: No evidence of parenchymal hemorrhage or extra-axial fluid collection. No mass lesion, mass effect, or midline shift. No CT evidence of acute infarction. Nonspecific mild subcortical and periventricular white matter hypodensity, most in keeping with chronic small vessel ischemic change. Cerebral volume is age appropriate. No ventriculomegaly. Partial opacification of the visualized right maxillary sinus by inspissated secretions. The mastoid air cells are unopacified. No evidence of calvarial fracture. IMPRESSION: 1.  No evidence of acute intracranial abnormality. 2. Nonspecific mild subcortical and periventricular white matter hypodensity, most suggestive of chronic small vessel ischemic change. 3. Limited visualization of right maxillary sinusitis. Electronically Signed   By: Delbert Phenix M.D.   On: 09/21/2015 13:34    Microbiology: Recent Results (from the past 240 hour(s))  Urine  culture     Status: None   Collection Time: 09/21/15  4:46 PM  Result Value Ref Range Status   Specimen Description URINE, RANDOM  Final   Special Requests NONE  Final   Culture   Final    1,000 COLONIES/mL INSIGNIFICANT GROWTH Performed at [facility]    Report Status 09/22/2015 FINAL  Final     Labs: Basic Metabolic Panel:  Recent Labs Lab 09/21/15 1132 09/22/15 0525  NA 129* 135  K 4.1 3.7  CL 95* 101  CO2 25 24  GLUCOSE 117* 92  BUN 9 8  CREATININE 0.48 0.52  CALCIUM 9.5 8.7*  MG 2.1  --    Liver Function Tests:  Recent Labs Lab 09/21/15 1132  AST 62*  ALT 54  ALKPHOS 81  BILITOT 0.9  PROT 7.8  ALBUMIN 4.4   No results for input(s): LIPASE, AMYLASE in the last 168 hours.  Recent Labs Lab 09/21/15 1310  AMMONIA 19   CBC:  Recent Labs Lab 09/21/15 1132  WBC 5.5  HGB 13.2  HCT 38.5  MCV 94.4  PLT 277   Cardiac Enzymes: No results for input(s): CKTOTAL, CKMB, CKMBINDEX, TROPONINI in the last 168 hours. BNP: BNP (last 3 results) No results for input(s): BNP in the last 8760 hours.  ProBNP (last 3 results) No results for input(s): PROBNP in the last 8760 hours.  CBG:  Recent Labs Lab 09/21/15 1214  GLUCAP 105*       Signed:  Penny Pia MD.  [facility] 09/24/2015, 1:34 PM

## 2015-09-24 NOTE — Clinical Social Work Maternal (Addendum)
CSW provided pt and family and updated AA meeting list as well as outpatient information on the [facility] for SA tx. Pt agrees to follow-up with outpatient services and will return home with family.    Etta Quill, LCSW [phone] Hospital psychiatric & 5E, 5W 31-35 Licensed Clinical Social Worker

## 2017-05-26 IMAGING — CR DG CHEST 2V
2 series · 2 of 2 positions shown · non-contrast
Comparison: None

CLINICAL DATA: Shortness of breath, patient smokes

EXAM:
CHEST  2 VIEW

[w chest lat]
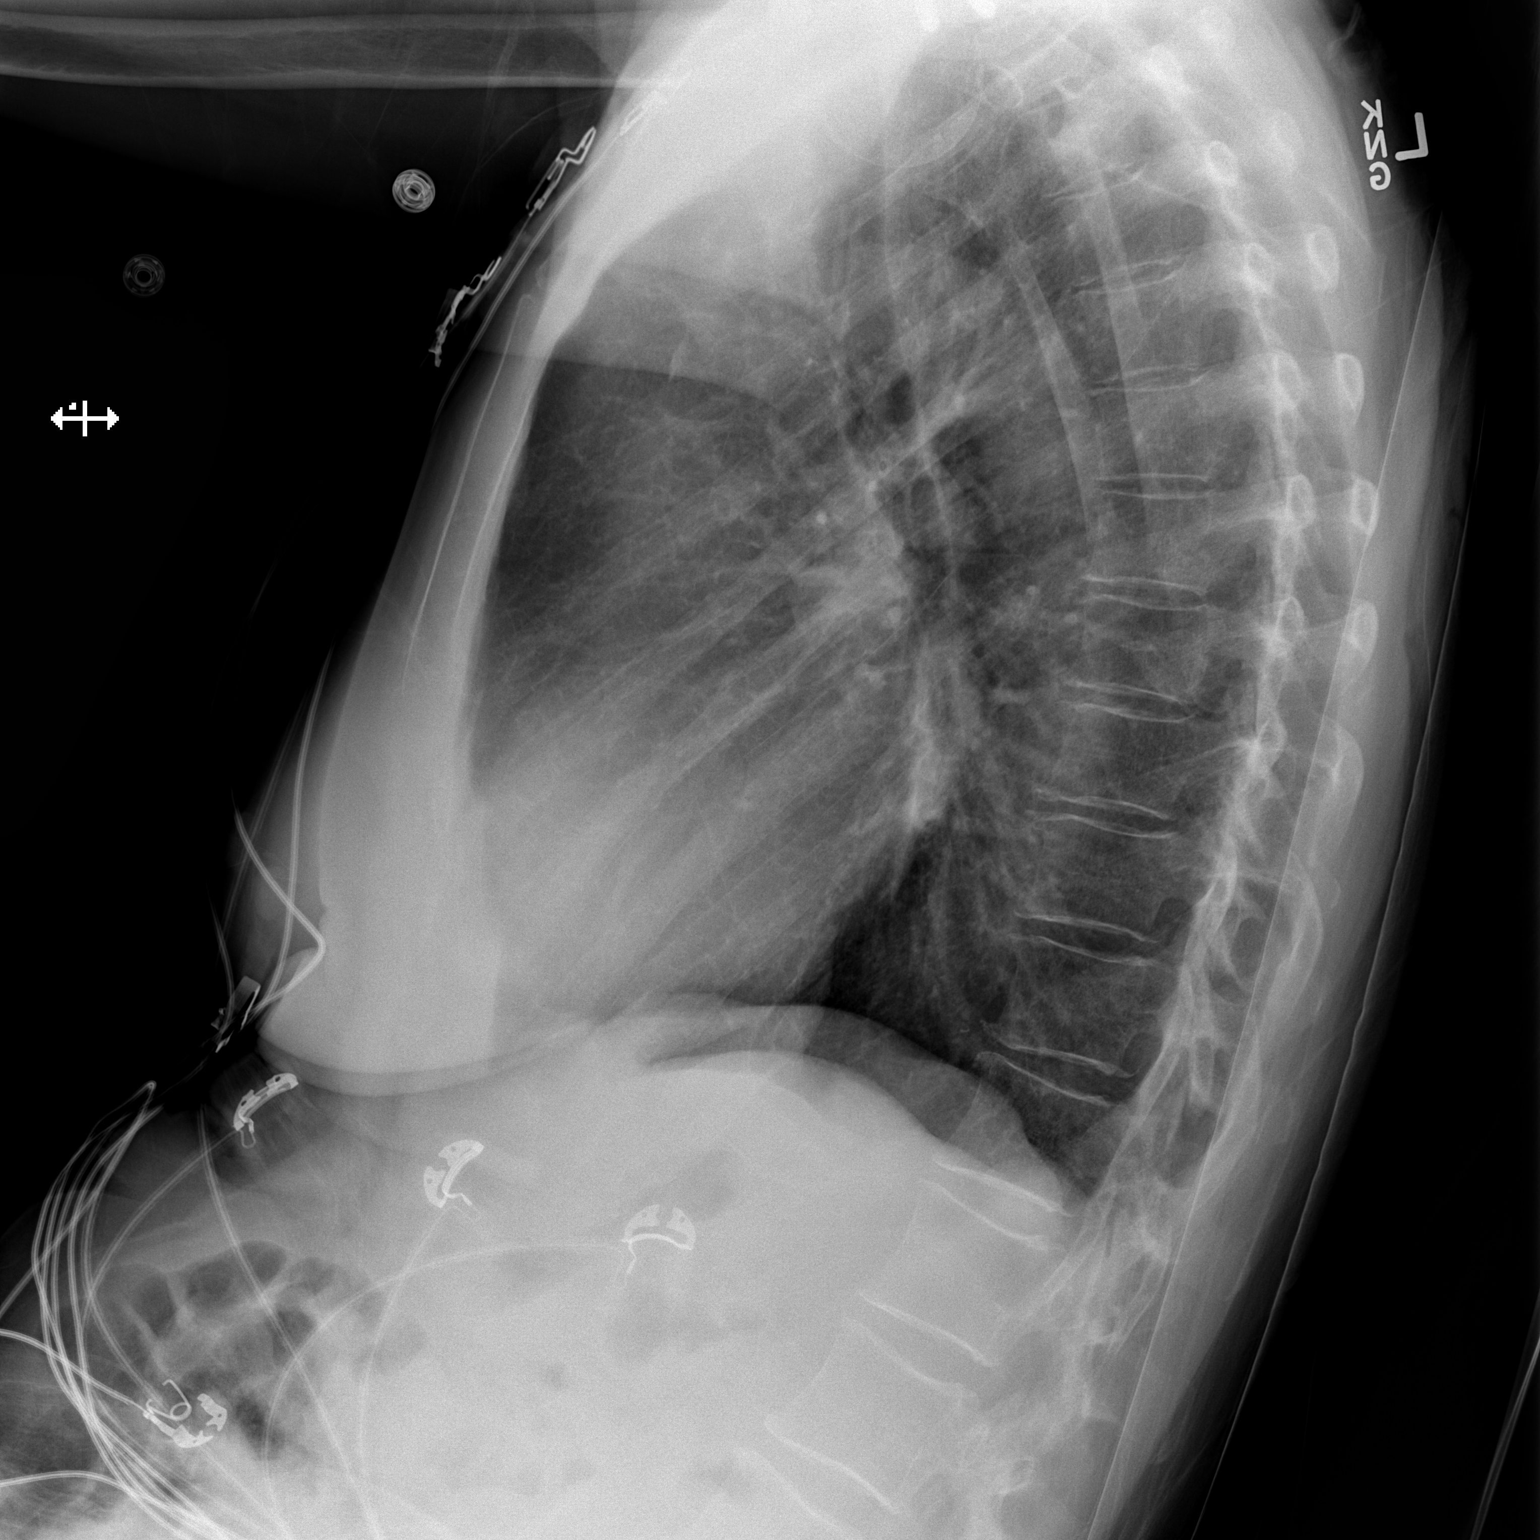

[x chest ap]
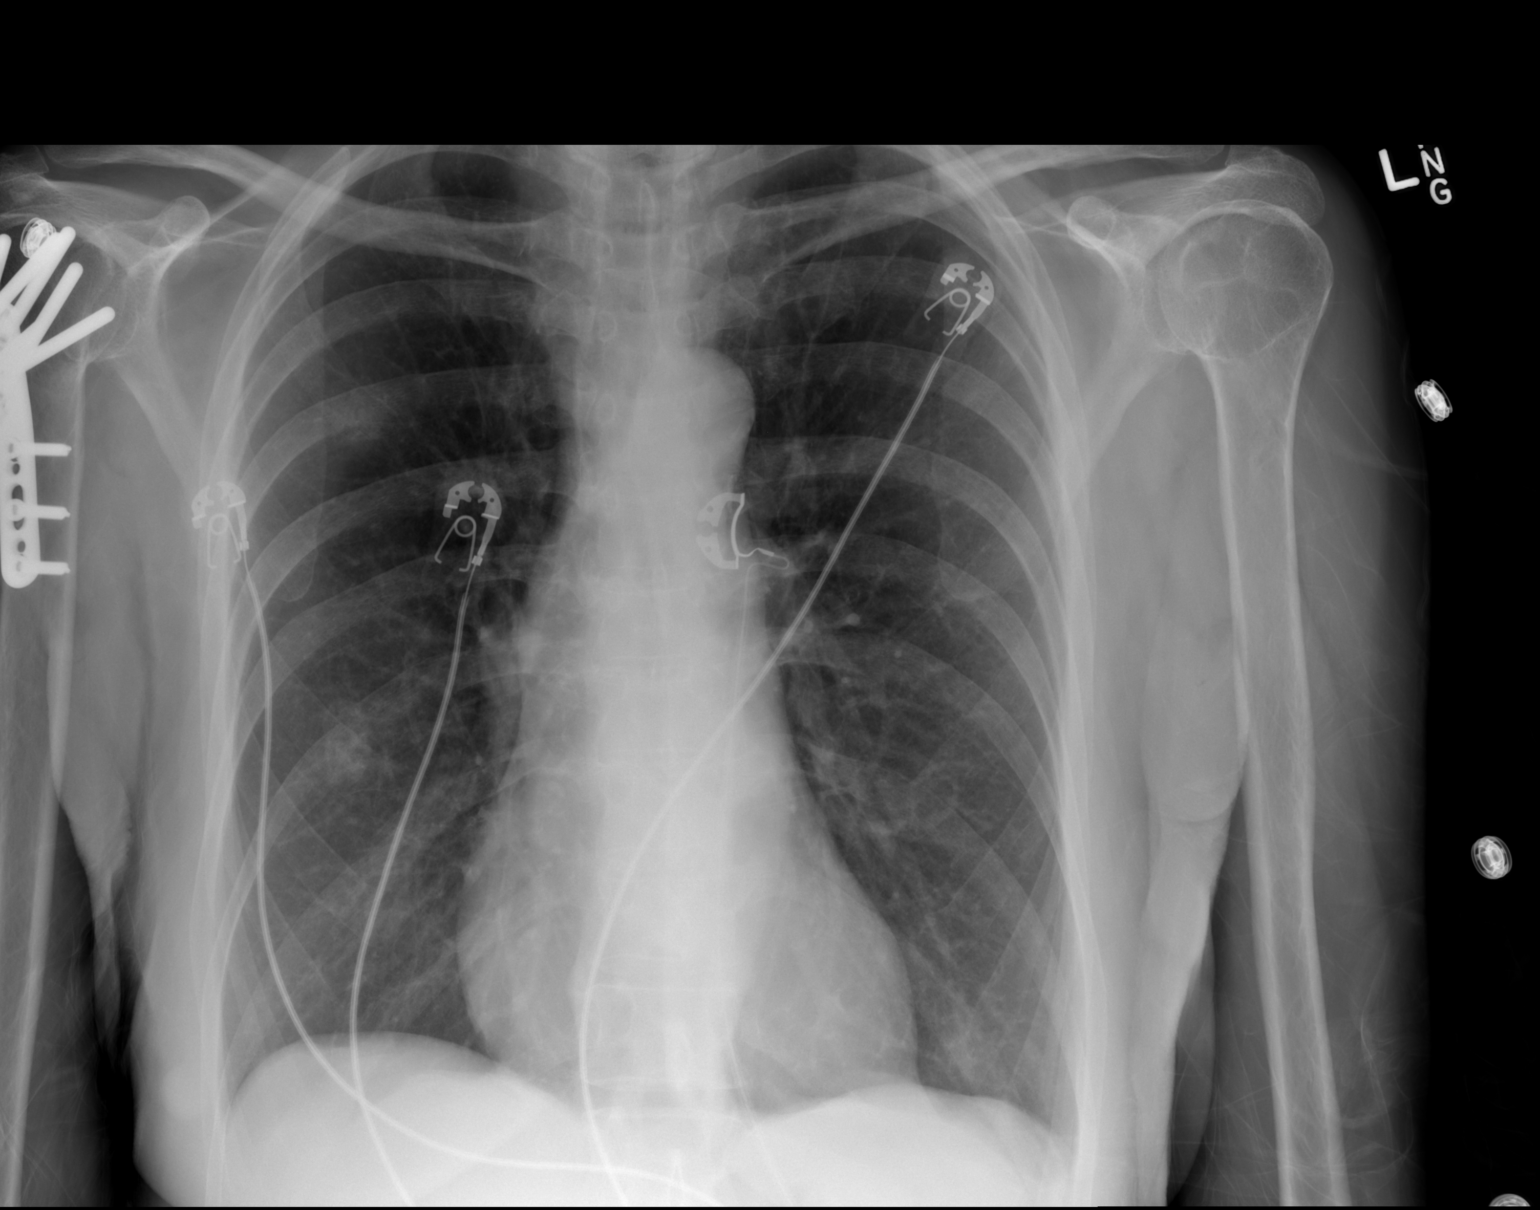

[2 of 2 positions shown; findings below may reference images not displayed]

FINDINGS: The heart size and vascular pattern are normal. There is no
consolidation or effusion. There is a rounded opacity measuring
about 1.5 cm over the right upper lobe. There is another similar
oval opacity measuring 2 cm over the right lower lobe. It is
possible that these both represent hyper trophic rib margins.
IMPRESSION: Suggest either CT thorax or rib series to determine whether the
nodular opacities may represent hyper trophic rib margins versus
pulmonary mass lesions.

These results will be called to the ordering clinician or
representative by the Radiologist Assistant, and communication
documented in the PACS or zVision Dashboard.

## 2017-05-26 IMAGING — CT CT HEAD W/O CM
2 series · 15 of 30 positions shown, 19 images · non-contrast
Comparison: None.

CLINICAL DATA: Altered mental status. Confusion. Hyponatremia.
Alcohol abuse.

EXAM:
CT HEAD WITHOUT CONTRAST
TECHNIQUE: Contiguous axial images were obtained from the base of the skull
through the vertex without intravenous contrast.

[Series 2: head w/o · axial · non-contrast · 0.45mm/px · z∈[-130,-10]mm · 13 of 28 slices shown, 17 images]
[im 2/28  brain]
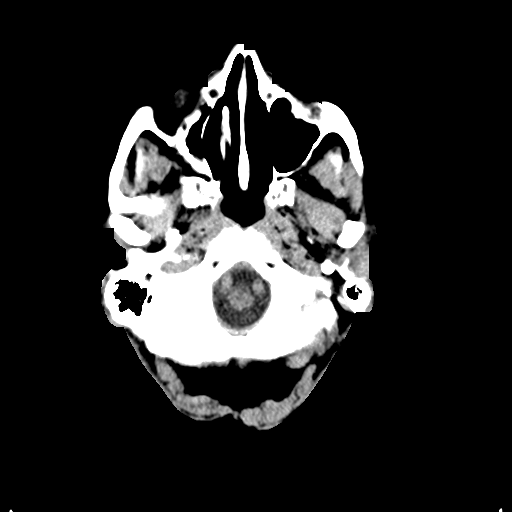
[im 2/28  bone]
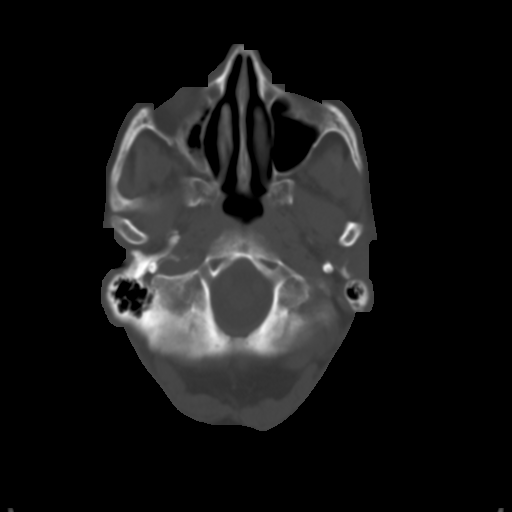
[im 4/28  brain]
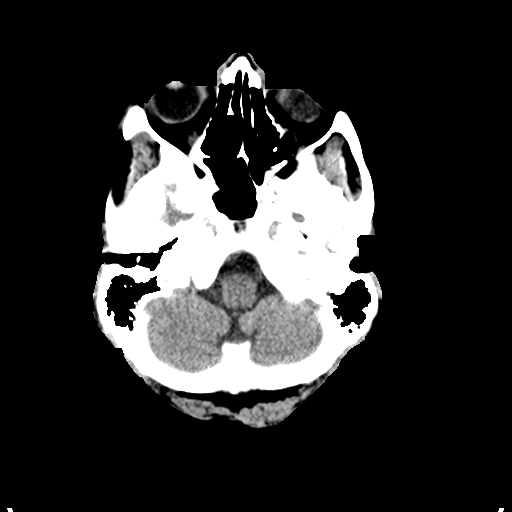
[im 6/28  brain]
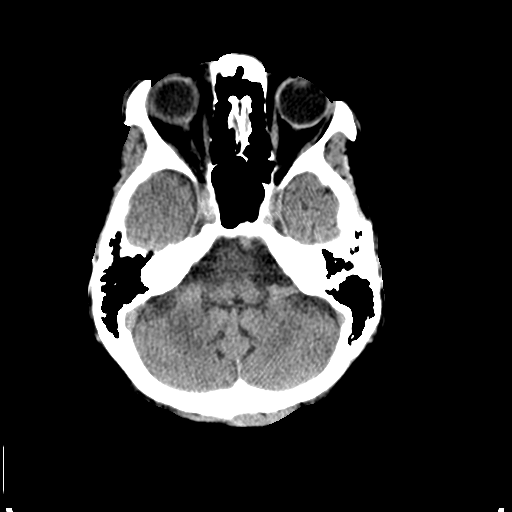
[im 8/28  brain]
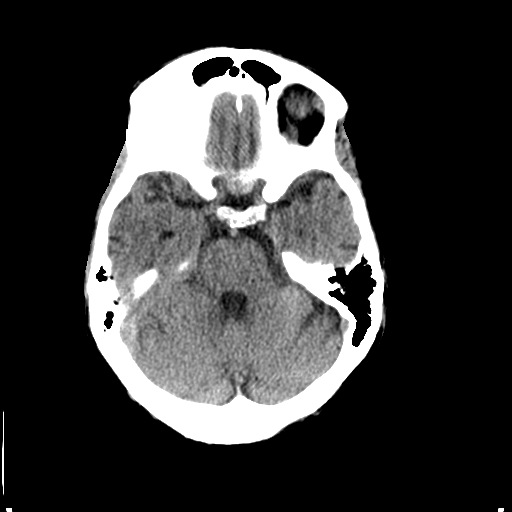
[im 10/28  brain]
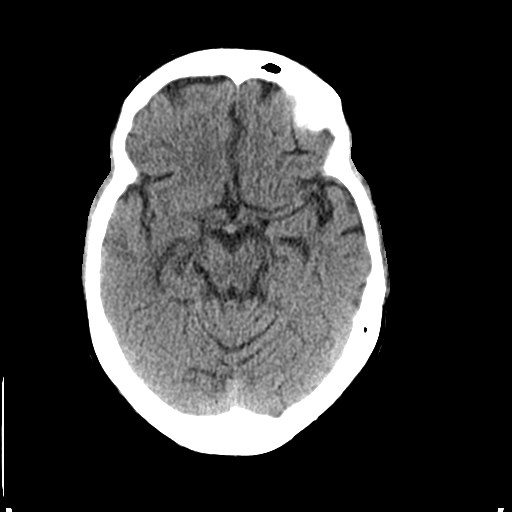
[im 10/28  bone]
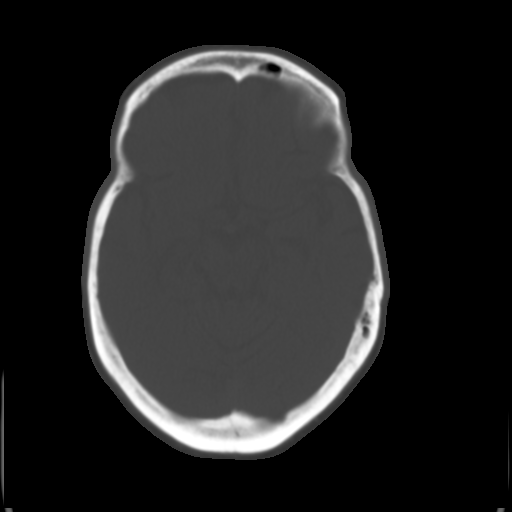
[im 12/28  brain]
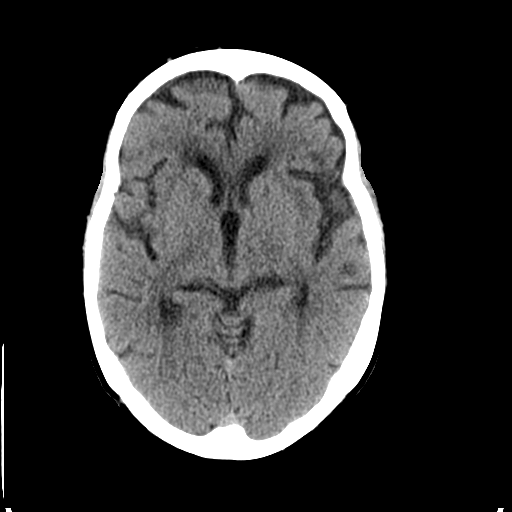
[im 14/28  brain]
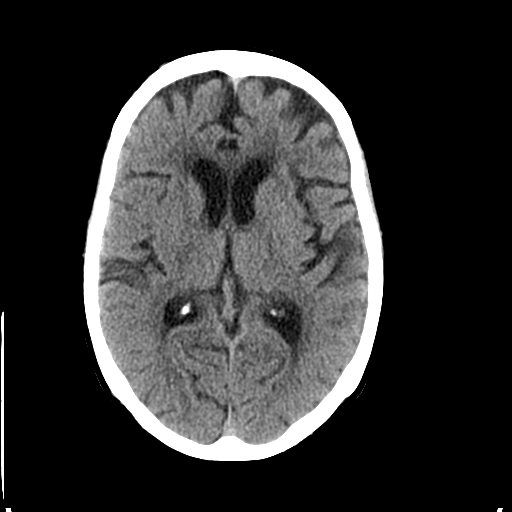
[im 16/28  brain]
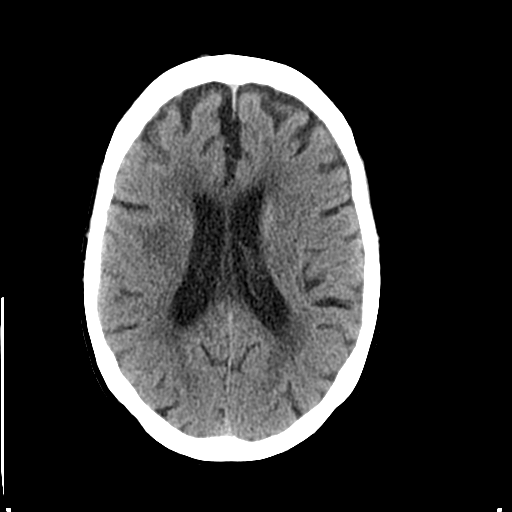
[im 18/28  brain]
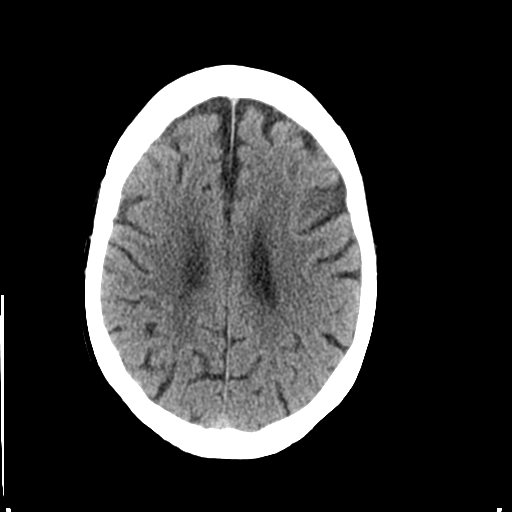
[im 18/28  bone]
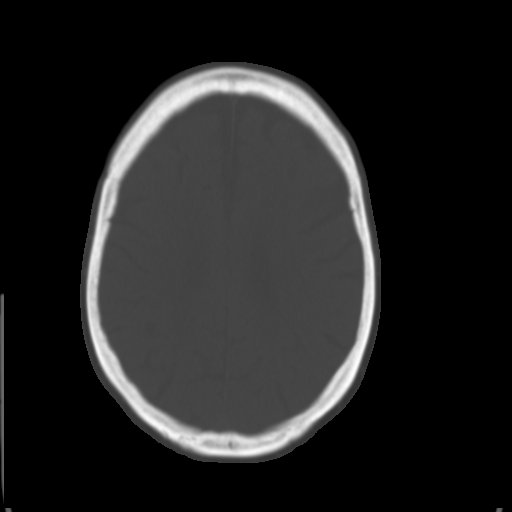
[im 20/28  brain]
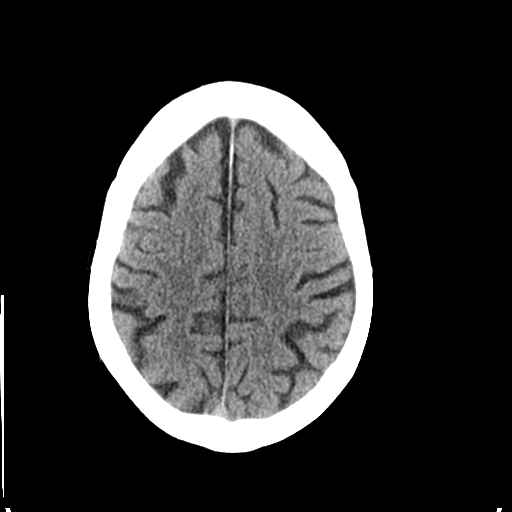
[im 22/28  brain]
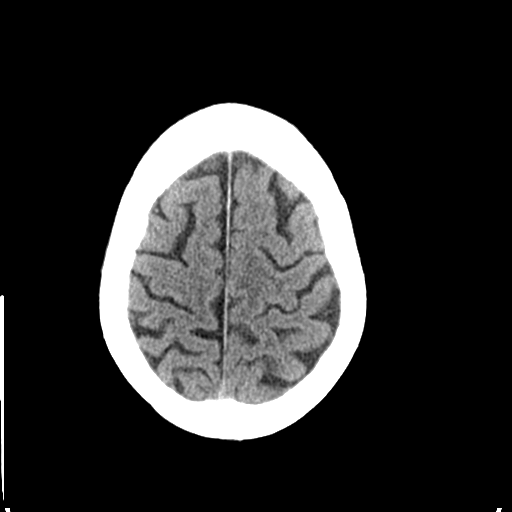
[im 24/28  brain]
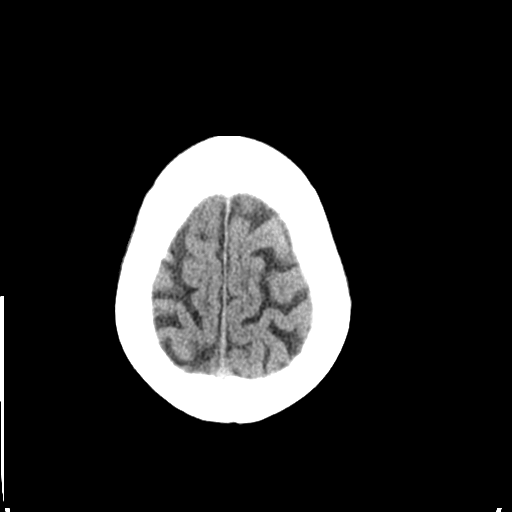
[im 26/28  brain]
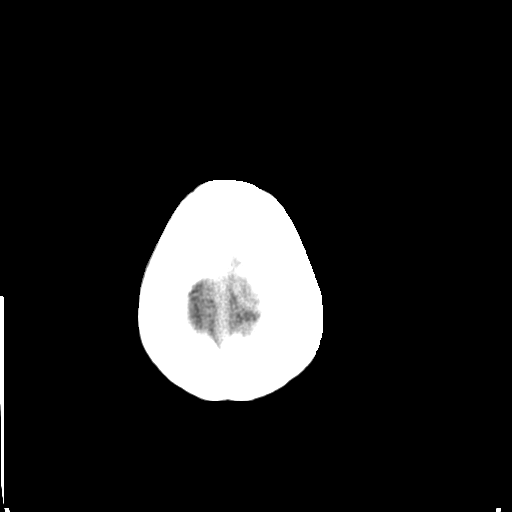
[im 26/28  bone]
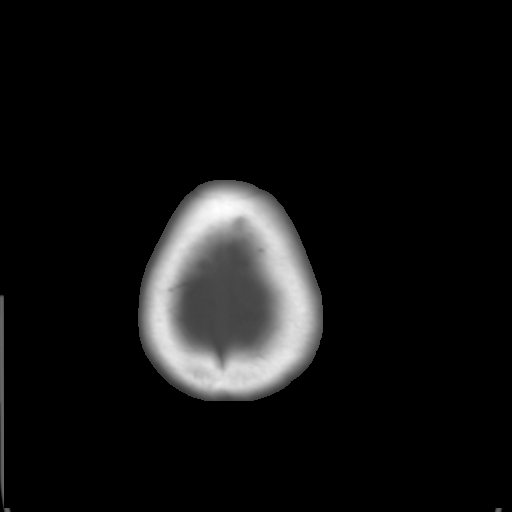

[Series 3: bone windows · axial · 0.45mm/px · z∈[-130,-110]mm · 2 of 28 slices shown]
[im 2/28  bone]
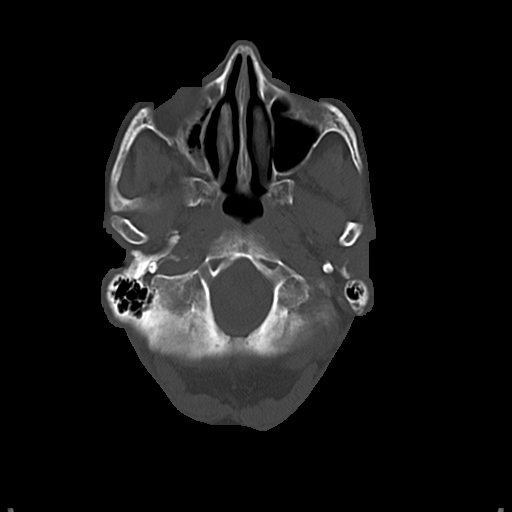
[im 6/28  bone]
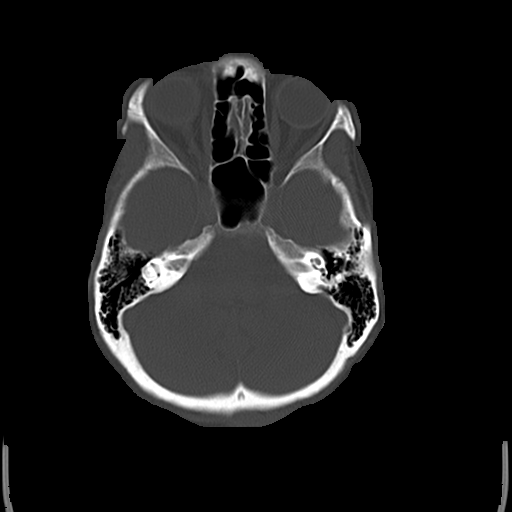

[15 of 30 positions shown; findings below may reference images not displayed]

FINDINGS: No evidence of parenchymal hemorrhage or extra-axial fluid
collection. No mass lesion, mass effect, or midline shift.

No CT evidence of acute infarction. Nonspecific mild subcortical and
periventricular white matter hypodensity, most in keeping with
chronic small vessel ischemic change.

Cerebral volume is age appropriate. No ventriculomegaly.

Partial opacification of the visualized right maxillary sinus by
inspissated secretions. The mastoid air cells are unopacified. No
evidence of calvarial fracture.
IMPRESSION: 1.  No evidence of acute intracranial abnormality.
2. Nonspecific mild subcortical and periventricular white matter
hypodensity, most suggestive of chronic small vessel ischemic
change.
3. Limited visualization of right maxillary sinusitis.

## 2017-05-30 ENCOUNTER — Other Ambulatory Visit: Payer: Self-pay | Admitting: Family Medicine

## 2017-05-30 DIAGNOSIS — Z720 Tobacco use: Secondary | ICD-10-CM

## 2017-06-05 ENCOUNTER — Ambulatory Visit: Payer: Self-pay

## 2019-11-29 ENCOUNTER — Ambulatory Visit: Payer: No Typology Code available for payment source | Attending: Internal Medicine

## 2019-11-29 DIAGNOSIS — Z23 Encounter for immunization: Secondary | ICD-10-CM

## 2019-11-29 NOTE — Progress Notes (Addendum)
   Covid-19 Vaccination Clinic  Name:  Brittany Powell    MRN: [id number] DOB: 02-Nov-1955  11/29/2019  [name] was observed post Covid-19 immunization for 30 minutes based on pre-vaccination screening without incident. She was provided with Vaccine Information Sheet and instruction to access the V-Safe system.   [name] was instructed to call 911 with any severe reactions post vaccine: Marland Kitchen Difficulty breathing  . Swelling of face and throat  . A fast heartbeat  . A bad rash all over body  . Dizziness and weakness   Immunizations Administered    Name Date Dose VIS Date Route   Pfizer COVID-19 Vaccine 11/29/2019 11:11 AM 0.3 mL 08/16/2019 Intramuscular   Manufacturer: [employer], [employer]   Lot: [id number]   NDC: [address]-0

## 2019-12-16 ENCOUNTER — Ambulatory Visit: Payer: Self-pay

## 2019-12-24 ENCOUNTER — Ambulatory Visit: Payer: No Typology Code available for payment source | Attending: Internal Medicine

## 2019-12-24 DIAGNOSIS — Z23 Encounter for immunization: Secondary | ICD-10-CM

## 2019-12-24 NOTE — Progress Notes (Signed)
   Covid-19 Vaccination Clinic  Name:  Brittany Powell    MRN: [id number] DOB: 1956/03/06  12/24/2019  [name] was observed post Covid-19 immunization for 15 minutes without incident. She was provided with Vaccine Information Sheet and instruction to access the V-Safe system.   [name] was instructed to call 911 with any severe reactions post vaccine: Marland Kitchen Difficulty breathing  . Swelling of face and throat  . A fast heartbeat  . A bad rash all over body  . Dizziness and weakness   Immunizations Administered    Name Date Dose VIS Date Route   Pfizer COVID-19 Vaccine 12/24/2019 11:06 AM 0.3 mL 10/30/2018 Intramuscular   Manufacturer: [employer], [employer]   Lot: [id number]   NDC: [address]-0
